# Patient Record
Sex: Female | Born: 1980 | Race: Black or African American | Hispanic: No | Marital: Single | Smoking: Never smoker
Health system: Southern US, Community
[De-identification: ages and names within clinical notes are randomized; demographics above are authoritative.]

## PROBLEM LIST (undated history)

## (undated) ENCOUNTER — Inpatient Hospital Stay (HOSPITAL_COMMUNITY): Payer: Self-pay

## (undated) DIAGNOSIS — T8859XA Other complications of anesthesia, initial encounter: Secondary | ICD-10-CM

## (undated) DIAGNOSIS — Z789 Other specified health status: Secondary | ICD-10-CM

## (undated) DIAGNOSIS — T4145XA Adverse effect of unspecified anesthetic, initial encounter: Secondary | ICD-10-CM

## (undated) DIAGNOSIS — O24419 Gestational diabetes mellitus in pregnancy, unspecified control: Secondary | ICD-10-CM

## (undated) HISTORY — DX: Other specified health status: Z78.9

## (undated) HISTORY — PX: APPENDECTOMY: SHX54

## (undated) HISTORY — DX: Adverse effect of unspecified anesthetic, initial encounter: T41.45XA

## (undated) HISTORY — DX: Other complications of anesthesia, initial encounter: T88.59XA

---

## 2016-05-18 ENCOUNTER — Encounter: Payer: Self-pay | Admitting: Emergency Medicine

## 2016-05-18 DIAGNOSIS — O26891 Other specified pregnancy related conditions, first trimester: Secondary | ICD-10-CM | POA: Insufficient documentation

## 2016-05-18 DIAGNOSIS — Z3A10 10 weeks gestation of pregnancy: Secondary | ICD-10-CM | POA: Insufficient documentation

## 2016-05-18 DIAGNOSIS — O219 Vomiting of pregnancy, unspecified: Secondary | ICD-10-CM | POA: Diagnosis present

## 2016-05-18 DIAGNOSIS — R102 Pelvic and perineal pain: Secondary | ICD-10-CM | POA: Diagnosis not present

## 2016-05-18 NOTE — ED Triage Notes (Signed)
Pt ambulatory to triage in NAD, states she may be 10-[redacted]wks pregnant LMP 12/25, report nausea and fever over past week.

## 2016-05-19 ENCOUNTER — Emergency Department: Payer: Medicaid Other

## 2016-05-19 ENCOUNTER — Emergency Department
Admission: EM | Admit: 2016-05-19 | Discharge: 2016-05-19 | Disposition: A | Discharge status: Home / Self Care | Payer: Medicaid Other | Attending: Emergency Medicine | Admitting: Emergency Medicine

## 2016-05-19 DIAGNOSIS — O219 Vomiting of pregnancy, unspecified: Secondary | ICD-10-CM

## 2016-05-19 DIAGNOSIS — R109 Unspecified abdominal pain: Secondary | ICD-10-CM

## 2016-05-19 DIAGNOSIS — O26891 Other specified pregnancy related conditions, first trimester: Secondary | ICD-10-CM

## 2016-05-19 LAB — COMPREHENSIVE METABOLIC PANEL
ALK PHOS: 67 U/L (ref 38–126)
ALT: 18 U/L (ref 14–54)
ANION GAP: 7 (ref 5–15)
AST: 20 U/L (ref 15–41)
Albumin: 3.6 g/dL (ref 3.5–5.0)
BILIRUBIN TOTAL: 0.5 mg/dL (ref 0.3–1.2)
BUN: 8 mg/dL (ref 6–20)
CALCIUM: 8.5 mg/dL — AB (ref 8.9–10.3)
CO2: 25 mmol/L (ref 22–32)
CREATININE: 0.67 mg/dL (ref 0.44–1.00)
Chloride: 104 mmol/L (ref 101–111)
GFR calc non Af Amer: >60 mL/min (ref >60)
GLUCOSE: 103 mg/dL — AB (ref 65–99)
Potassium: 3.3 mmol/L — ABNORMAL LOW (ref 3.5–5.1)
Sodium: 136 mmol/L (ref 135–145)
TOTAL PROTEIN: 6.8 g/dL (ref 6.5–8.1)

## 2016-05-19 LAB — URINALYSIS, COMPLETE (UACMP) WITH MICROSCOPIC
Bilirubin Urine: NEGATIVE
GLUCOSE, UA: NEGATIVE mg/dL
HGB URINE DIPSTICK: NEGATIVE
Ketones, ur: NEGATIVE mg/dL
Leukocytes, UA: NEGATIVE
NITRITE: NEGATIVE
PH: 5 (ref 5.0–8.0)
PROTEIN: NEGATIVE mg/dL
RBC / HPF: NONE SEEN RBC/hpf (ref 0–5)
SPECIFIC GRAVITY, URINE: 1.024 (ref 1.005–1.030)

## 2016-05-19 LAB — CBC
HCT: 35.4 % (ref 35.0–47.0)
HEMOGLOBIN: 12.1 g/dL (ref 12.0–16.0)
MCH: 30.7 pg (ref 26.0–34.0)
MCHC: 34.1 g/dL (ref 32.0–36.0)
MCV: 90.2 fL (ref 80.0–100.0)
PLATELETS: 235 10*3/uL (ref 150–440)
RBC: 3.92 MIL/uL (ref 3.80–5.20)
RDW: 13.9 % (ref 11.5–14.5)
WBC: 9.6 10*3/uL (ref 3.6–11.0)

## 2016-05-19 LAB — HCG, QUANTITATIVE, PREGNANCY: hCG, Beta Chain, Quant, S: 55648 m[IU]/mL — ABNORMAL HIGH (ref <5)

## 2016-05-19 LAB — LIPASE, BLOOD: Lipase: 16 U/L (ref 11–51)

## 2016-05-19 LAB — POCT PREGNANCY, URINE: Preg Test, Ur: POSITIVE — AB

## 2016-05-19 MED ORDER — ONDANSETRON HCL 4 MG/2ML IJ SOLN: 4.0000 mg | INTRAMUSCULAR | Status: DC

## 2016-05-19 MED ORDER — ONDANSETRON 4 MG PO TBDP: ORAL_TABLET | ORAL | 0 refills | Status: DC

## 2016-05-19 MED ORDER — ONDANSETRON HCL 4 MG/2ML IJ SOLN
4.0000 mg | INTRAMUSCULAR | Status: AC
  Administered 2016-05-19: 4 mg via INTRAVENOUS
  Filled 2016-05-19: qty 2

## 2016-05-19 NOTE — ED Notes (Signed)
Pt. States certain smells will make her feel nauseated, pt. States "I am not able to wear certain perfumes or smell some foods.  Pt. States symptoms started about one week ago.

## 2016-05-19 NOTE — ED Notes (Signed)
Attempted blood draw from right antecub without success. 

## 2016-05-19 NOTE — ED Provider Notes (Signed)
[facility] Emergency Department Provider Note  ____________________________________________   First MD Initiated Contact with Patient 05/19/16 [phone]     (approximate)  I have reviewed the triage vital signs and the nursing notes.   HISTORY  Chief Complaint Nausea and Fever    HPI Vicki Griffin is a 36 y.o. female G3P2 at about 10-11 weeks by dates who presents for evaluation of gradual onset nausea and vomiting and occasional lower abdominal pain for weeks.  She states that she thought it was because she was pregnant but she thought that the nausea and vomiting should have improved by now.  Certain smells and foods make it worse and nothing in particular makes it better.  She has not sought prenatal care yet because she is new to the area.  She has 2 children at home and no history of miscarriage or abortion.  She denies vaginal bleeding.  She is also not had any dysuria.  No fever/chills, chest pain, shortness of breath, diarrhea.  She occasionally has a cough which makes the aching abdominal pain worse.  She has no abdominal pain at this time.  She did not have any complaints of fever to me although apparently she mentioned it in triage.   History reviewed. No pertinent past medical history.  There are no active problems to display for this patient.   History reviewed. No pertinent surgical history.  Prior to Admission medications   Medication Sig Start Date End Date Taking? Authorizing Provider  ondansetron (ZOFRAN ODT) 4 MG disintegrating tablet Allow 1-2 tablets to dissolve in your mouth every 8 hours as needed for nausea/vomiting 05/19/16   Loleta Roseory Delrick Dehart, MD    Allergies Patient has no known allergies.  History reviewed. No pertinent family history.  Social History Social History  Substance Use Topics  . Smoking status: Never Smoker  . Smokeless tobacco: Never Used  . Alcohol use No    Review of Systems Constitutional: No  fever/chills Eyes: No visual changes. ENT: No sore throat. Cardiovascular: Denies chest pain. Respiratory: Denies shortness of breath. Gastrointestinal: Occasional abdominal pain.  Persistent N/V.  No diarrhea.  No constipation. Genitourinary: Negative for dysuria.  No vaginal bleeding. Musculoskeletal: Negative for back pain. Skin: Negative for rash. Neurological: Negative for headaches, focal weakness or numbness.  10-point ROS otherwise negative.  ____________________________________________   PHYSICAL EXAM:  VITAL SIGNS: ED Triage Vitals  Enc Vitals Group     BP 05/18/16 2327 (!) 150/120     Pulse Rate 05/18/16 2327 90     Resp 05/18/16 2327 18     Temp 05/18/16 2327 99.3 F (37.4 C)     Temp Source 05/18/16 2327 Oral     SpO2 05/18/16 2327 97 %     Weight 05/18/16 2327 227 lb 9.6 oz (103.2 kg)     Height 05/18/16 2327 5\' 2"  (1.575 m)     Head Circumference --      Peak Flow --      Pain Score 05/18/16 2328 0     Pain Loc --      Pain Edu? --      Excl. in GC? --     Constitutional: Alert and oriented. Well appearing and in no acute distress. Eyes: Conjunctivae are normal. PERRL. EOMI. Head: Atraumatic. Nose: No congestion/rhinnorhea. Mouth/Throat: Mucous membranes are moist. Neck: No stridor.  No meningeal signs.   Cardiovascular: Normal rate, regular rhythm. Good peripheral circulation. Grossly normal heart sounds. Respiratory: Normal respiratory effort.  No  retractions. Lungs CTAB. Gastrointestinal: Soft and nontender. No distention.  Genitourinary: deferred Musculoskeletal: No lower extremity tenderness nor edema. No gross deformities of extremities. Neurologic:  Normal speech and language. No gross focal neurologic deficits are appreciated.  Skin:  Skin is warm, dry and intact. No rash noted. Psychiatric: Mood and affect are flat.  Normal speech.  ____________________________________________   LABS (all labs ordered are listed, but only abnormal  results are displayed)  Labs Reviewed  COMPREHENSIVE METABOLIC PANEL - Abnormal; Notable for the following:       Result Value   Potassium 3.3 (*)    Glucose, Bld 103 (*)    Calcium 8.5 (*)    All other components within normal limits  URINALYSIS, COMPLETE (UACMP) WITH MICROSCOPIC - Abnormal; Notable for the following:    Color, Urine YELLOW (*)    APPearance HAZY (*)    Bacteria, UA RARE (*)    Squamous Epithelial / LPF 0-5 (*)    All other components within normal limits  HCG, QUANTITATIVE, PREGNANCY - Abnormal; Notable for the following:    hCG, Beta Chain, Quant, S 96,045 (*)    All other components within normal limits  POCT PREGNANCY, URINE - Abnormal; Notable for the following:    Preg Test, Ur POSITIVE (*)    All other components within normal limits  LIPASE, BLOOD  CBC  POC URINE PREG, ED   ____________________________________________  EKG  None - EKG not ordered by ED physician ____________________________________________  RADIOLOGY   US Ob Comp < 14 Wks  Result Date: 05/19/2016 CLINICAL DATA:  Acute onset of pelvic pain.  Initial encounter. EXAM: OBSTETRIC <14 WK ULTRASOUND TECHNIQUE: Transabdominal ultrasound was performed for evaluation of the gestation as well as the maternal uterus and adnexal regions. COMPARISON:  None. FINDINGS: Intrauterine gestational sac: Single; visualized and normal in shape. Yolk sac:  No Embryo:  Yes Cardiac Activity: Yes Heart Rate: 165 bpm CRL:   3.3 cm   10 w 1 d                  [place] EDC: 12/14/2016 Subchorionic hemorrhage:  None visualized. Maternal uterus/adnexae: The uterus is unremarkable in appearance. The ovaries are within normal limits. The right ovary measures 3.1 x 2.2 x 1.6 cm, while the left ovary measures 4.1 x 3.1 x 2.3 cm. No suspicious adnexal masses are seen; there is no evidence for ovarian torsion. No free fluid is seen within the pelvic cul-de-sac. IMPRESSION: Single live intrauterine pregnancy noted, with a crown-rump  length of 3.3 cm, corresponding to a gestational age of [redacted] weeks 1 day. This matches the gestational age of [redacted] weeks 5 days by LMP, reflecting an estimated date of delivery of December 17, 2016. Electronically Signed   By: Roanna Raider M.D.   On: 05/19/2016 04:14    ____________________________________________   PROCEDURES  Procedure(s) performed:   Procedures   Critical Care performed: No ____________________________________________   INITIAL IMPRESSION / ASSESSMENT AND PLAN / ED COURSE  Pertinent labs & imaging results that were available during my care of the patient were reviewed by me and considered in my medical decision making (see chart for details).  Well-appearing, NAD.  Doubt 150/120 was initial BP, will recheck.  Still some persistent nausea, will give additional Zofran.  Abd is non-tender to palpation. Will check bedside U/S.  Patient has no history of vaginal bleeding which is reassuring.  Counseled patient about importance of establishing care with an OB provider.   Clinical  Course as of May 19 448  Sat May 19, 2016  0252 Difficulty visualizing fetus on bedside ultrasound.  Able to appreciate cardiac activity, but unable to fully visualize the full fetus, as will as unable to r/o previa, abruption, etc.  Will obtain formal U/S including transabdominal and transvaginal approaches.  Patient prefers this and agrees with plan.  [CF]  0443 VSS, patient in NAD.  U/S reassuring.  Updated patient, gave usual/customary return precautions.  Patient agrees with plan for outpatient follow up.  [CF]    Clinical Course User Index [CF] Loleta Rose, MD    ____________________________________________  FINAL CLINICAL IMPRESSION(S) / ED DIAGNOSES  Final diagnoses:  Abdominal pain during pregnancy in first trimester  Vomiting during pregnancy     MEDICATIONS GIVEN DURING THIS VISIT:  Medications  ondansetron (ZOFRAN) injection 4 mg (0 mg Intravenous Hold 05/19/16 0303)   ondansetron (ZOFRAN) injection 4 mg (4 mg Intravenous Given 05/19/16 0157)     NEW OUTPATIENT MEDICATIONS STARTED DURING THIS VISIT:  New Prescriptions   ONDANSETRON (ZOFRAN ODT) 4 MG DISINTEGRATING TABLET    Allow 1-2 tablets to dissolve in your mouth every 8 hours as needed for nausea/vomiting    Modified Medications   No medications on file    Discontinued Medications   No medications on file     Note:  This document was prepared using Dragon voice recognition software and may include unintentional dictation errors.    Loleta Rose, MD 05/19/16 [phone]

## 2016-05-19 NOTE — Discharge Instructions (Signed)
You have been seen in the Emergency Department (ED) for intermittent abdominal pain and vomiting during pregnancy.  Fortunately, your evaluation was reassuring, and the ultrasound showed a normal pregnancy at about [redacted] weeks gestation.  Please start taking prenatal vitamins (over the counter) if you are not already doing so.    Please follow up as recommended above.  If you develop any other symptoms that concern you (including, but not limited to, persistent vomiting, worsening bleeding, abdominal or pelvic pain, or fever greater than 101), please return immediately to the Emergency Department.

## 2016-05-19 NOTE — ED Notes (Signed)
Patient transported to Ultrasound 

## 2016-05-19 NOTE — ED Notes (Signed)
Pt. Going home with friend 

## 2016-08-01 ENCOUNTER — Other Ambulatory Visit (HOSPITAL_COMMUNITY)
Admission: RE | Admit: 2016-08-01 | Discharge: 2016-08-01 | Disposition: A | Discharge status: Home / Self Care | Payer: Medicaid Other | Source: Ambulatory Visit | Attending: Family Medicine | Admitting: Family Medicine

## 2016-08-01 ENCOUNTER — Encounter: Payer: Self-pay | Admitting: Family Medicine

## 2016-08-01 ENCOUNTER — Ambulatory Visit (INDEPENDENT_AMBULATORY_CARE_PROVIDER_SITE_OTHER): Payer: Medicaid Other | Admitting: Family Medicine

## 2016-08-01 DIAGNOSIS — Z3480 Encounter for supervision of other normal pregnancy, unspecified trimester: Secondary | ICD-10-CM | POA: Insufficient documentation

## 2016-08-01 DIAGNOSIS — Z3482 Encounter for supervision of other normal pregnancy, second trimester: Secondary | ICD-10-CM

## 2016-08-01 DIAGNOSIS — O0932 Supervision of pregnancy with insufficient antenatal care, second trimester: Secondary | ICD-10-CM

## 2016-08-01 DIAGNOSIS — O0990 Supervision of high risk pregnancy, unspecified, unspecified trimester: Secondary | ICD-10-CM | POA: Insufficient documentation

## 2016-08-01 DIAGNOSIS — O99212 Obesity complicating pregnancy, second trimester: Secondary | ICD-10-CM

## 2016-08-01 DIAGNOSIS — O0930 Supervision of pregnancy with insufficient antenatal care, unspecified trimester: Secondary | ICD-10-CM | POA: Insufficient documentation

## 2016-08-01 DIAGNOSIS — E669 Obesity, unspecified: Secondary | ICD-10-CM

## 2016-08-01 DIAGNOSIS — O99210 Obesity complicating pregnancy, unspecified trimester: Secondary | ICD-10-CM | POA: Insufficient documentation

## 2016-08-01 DIAGNOSIS — Z98891 History of uterine scar from previous surgery: Secondary | ICD-10-CM | POA: Insufficient documentation

## 2016-08-01 DIAGNOSIS — O09522 Supervision of elderly multigravida, second trimester: Secondary | ICD-10-CM | POA: Insufficient documentation

## 2016-08-01 DIAGNOSIS — O34219 Maternal care for unspecified type scar from previous cesarean delivery: Secondary | ICD-10-CM

## 2016-08-01 NOTE — Progress Notes (Signed)
   Initial PRENATAL VISIT NOTE  Subjective:  Vicki Griffin is a 36 y.o. G3P2002 at 7729w2d being seen today for ongoing prenatal care.  She is currently monitored for the following issues for this high-risk pregnancy and has Supervision of other normal pregnancy, antepartum; AMA (advanced maternal age) multigravida 35+, second trimester; Late prenatal care affecting pregnancy, antepartum; History of C-section; and Obesity in pregnancy, antepartum on her problem list.  [facility], had US there consistent with LMP Reports PTL with prior pregnancy, CS at 37 weeks.   Patient reports no complaints.   . Vag. Bleeding: None.  Movement: Present. Denies leaking of fluid.   The following portions of the patient's history were reviewed and updated as appropriate: allergies, current medications, past family history, past medical history, past social history, past surgical history and problem list. Problem list updated.  Objective:   Vitals:   08/01/16 1602  BP: 122/75  Pulse: 97  Weight: 247 lb (112 kg)    Fetal Status: Fetal Heart Rate (bpm): 154 Fundal Height: 21 cm Movement: Present     General:  Alert, oriented and cooperative. Patient is in no acute distress.  Skin: Skin is warm and dry. No rash noted.   Cardiovascular: Normal heart rate noted  Respiratory: Normal respiratory effort, no problems with respiration noted  Abdomen: Soft, gravid, appropriate for gestational age. Pain/Pressure: Present     Pelvic:  Cervical exam deferred        Extremities: Normal range of motion.  Edema: Trace  Mental Status: Normal mood and affect. Normal behavior. Normal judgment and thought content.   Assessment and Plan:  Pregnancy: G3P2002 at 3929w2d  1. Supervision of other normal pregnancy, antepartum - Initial prenatal visit, reviewed history.  - Inherit[employer] Society Guided - Cytology - PAP - Obstetric Panel, Including HIV - Culture, OB Urine - Glucose, random - Hemoglobin A1c - US MFM OB  DETAIL +14 WK; Future  2. AMA (advanced maternal age) multigravida 35+, second trimester - US MFM OB DETAIL +14 WK; Future  3. Late prenatal care affecting pregnancy, antepartum  4. History of C-section Plan for scheduled repeat CS at 39 weeks, desires BTL  The nature of [facility] with multiple MDs and other Advanced Practice Providers was explained to patient; also emphasized that residents, students are part of our team.  >50% of 30 min visit spent on counseling and coordination of care.   Preterm labor symptoms and general obstetric precautions including but not limited to vaginal bleeding, contractions, leaking of fluid and fetal movement were reviewed in detail with the patient. Please refer to After Visit Summary for other counseling recommendations.  Return in about 4 weeks (around 08/29/2016) for Routine prenatal care.   Federico FlakeKimberly Niles Lilo Wallington, MD

## 2016-08-01 NOTE — Patient Instructions (Signed)
Second Trimester of Pregnancy The second trimester is from week 13 through week 28, month 4 through 6. This is often the time in pregnancy that you feel your best. Often times, morning sickness has lessened or quit. You may have more energy, and you may get hungry more often. Your unborn baby (fetus) is growing rapidly. At the end of the sixth month, he or she is about 9 inches long and weighs about 1 pounds. You will likely feel the baby move (quickening) between 18 and 20 weeks of pregnancy. Follow these instructions at home:  Avoid all smoking, herbs, and alcohol. Avoid drugs not approved by your doctor.  Do not use any tobacco products, including cigarettes, chewing tobacco, and electronic cigarettes. If you need help quitting, ask your doctor. You may get counseling or other support to help you quit.  Only take medicine as told by your doctor. Some medicines are safe and some are not during pregnancy.  Exercise only as told by your doctor. Stop exercising if you start having cramps.  Eat regular, healthy meals.  Wear a good support bra if your breasts are tender.  Do not use hot tubs, steam rooms, or saunas.  Wear your seat belt when driving.  Avoid raw meat, uncooked cheese, and liter boxes and soil used by cats.  Take your prenatal vitamins.  Take 1500-2000 milligrams of calcium daily starting at the 20th week of pregnancy until you deliver your baby.  Try taking medicine that helps you poop (stool softener) as needed, and if your doctor approves. Eat more fiber by eating fresh fruit, vegetables, and whole grains. Drink enough fluids to keep your pee (urine) clear or pale yellow.  Take warm water baths (sitz baths) to soothe pain or discomfort caused by hemorrhoids. Use hemorrhoid cream if your doctor approves.  If you have puffy, bulging veins (varicose veins), wear support hose. Raise (elevate) your feet for 15 minutes, 3-4 times a day. Limit salt in your diet.  Avoid heavy  lifting, wear low heals, and sit up straight.  Rest with your legs raised if you have leg cramps or low back pain.  Visit your dentist if you have not gone during your pregnancy. Use a soft toothbrush to brush your teeth. Be gentle when you floss.  You can have sex (intercourse) unless your doctor tells you not to.  Go to your doctor visits. Get help if:  You feel dizzy.  You have mild cramps or pressure in your lower belly (abdomen).  You have a nagging pain in your belly area.  You continue to feel sick to your stomach (nauseous), throw up (vomit), or have watery poop (diarrhea).  You have bad smelling fluid coming from your vagina.  You have pain with peeing (urination). Get help right away if:  You have a fever.  You are leaking fluid from your vagina.  You have spotting or bleeding from your vagina.  You have severe belly cramping or pain.  You lose or gain weight rapidly.  You have trouble catching your breath and have chest pain.  You notice sudden or extreme puffiness (swelling) of your face, hands, ankles, feet, or legs.  You have not felt the baby move in over an hour.  You have severe headaches that do not go away with medicine.  You have vision changes. This information is not intended to replace advice given to you by your health care provider. Make sure you discuss any questions you have with your health care   provider. Document Released: 05/30/2009 Document Revised: 08/11/2015 Document Reviewed: 05/06/2012 Elsevier Interactive Patient Education  2017 [employer]  

## 2016-08-02 ENCOUNTER — Encounter: Payer: Self-pay | Admitting: *Deleted

## 2016-08-03 LAB — CULTURE, OB URINE

## 2016-08-03 LAB — URINE CULTURE, OB REFLEX: ORGANISM ID, BACTERIA: NO GROWTH

## 2016-08-06 LAB — CYTOLOGY - PAP
Adequacy: ABSENT
Chlamydia: NEGATIVE
Diagnosis: NEGATIVE
HPV: NOT DETECTED
Neisseria Gonorrhea: NEGATIVE

## 2016-08-07 ENCOUNTER — Ambulatory Visit (HOSPITAL_COMMUNITY)
Admission: RE | Admit: 2016-08-07 | Discharge: 2016-08-07 | Disposition: A | Discharge status: Home / Self Care | Payer: Medicaid Other | Source: Ambulatory Visit | Attending: Family Medicine | Admitting: Family Medicine

## 2016-08-07 ENCOUNTER — Encounter (HOSPITAL_COMMUNITY): Payer: Self-pay

## 2016-08-07 ENCOUNTER — Other Ambulatory Visit: Payer: Self-pay | Admitting: Family Medicine

## 2016-08-07 DIAGNOSIS — O09522 Supervision of elderly multigravida, second trimester: Secondary | ICD-10-CM | POA: Diagnosis not present

## 2016-08-07 DIAGNOSIS — O09219 Supervision of pregnancy with history of pre-term labor, unspecified trimester: Secondary | ICD-10-CM | POA: Diagnosis not present

## 2016-08-07 DIAGNOSIS — Z363 Encounter for antenatal screening for malformations: Secondary | ICD-10-CM

## 2016-08-07 DIAGNOSIS — Z3A21 21 weeks gestation of pregnancy: Secondary | ICD-10-CM

## 2016-08-07 DIAGNOSIS — Z8279 Family history of other congenital malformations, deformations and chromosomal abnormalities: Secondary | ICD-10-CM | POA: Insufficient documentation

## 2016-08-07 DIAGNOSIS — O0930 Supervision of pregnancy with insufficient antenatal care, unspecified trimester: Secondary | ICD-10-CM

## 2016-08-07 DIAGNOSIS — Z3482 Encounter for supervision of other normal pregnancy, second trimester: Secondary | ICD-10-CM | POA: Diagnosis not present

## 2016-08-07 DIAGNOSIS — O0932 Supervision of pregnancy with insufficient antenatal care, second trimester: Secondary | ICD-10-CM | POA: Diagnosis not present

## 2016-08-07 DIAGNOSIS — Z3480 Encounter for supervision of other normal pregnancy, unspecified trimester: Secondary | ICD-10-CM

## 2016-08-07 DIAGNOSIS — O99212 Obesity complicating pregnancy, second trimester: Secondary | ICD-10-CM | POA: Diagnosis not present

## 2016-08-07 DIAGNOSIS — O34219 Maternal care for unspecified type scar from previous cesarean delivery: Secondary | ICD-10-CM | POA: Diagnosis not present

## 2016-08-07 DIAGNOSIS — Z98891 History of uterine scar from previous surgery: Secondary | ICD-10-CM

## 2016-08-07 DIAGNOSIS — O99210 Obesity complicating pregnancy, unspecified trimester: Secondary | ICD-10-CM

## 2016-08-07 NOTE — Addendum Note (Signed)
Encounter addended by: Tommie Raymondester, Brinnley Lacap T on: 08/07/2016  4:52 PM<BR>    Actions taken: Imaging Exam ended

## 2016-08-08 ENCOUNTER — Other Ambulatory Visit (HOSPITAL_COMMUNITY): Payer: Self-pay | Admitting: *Deleted

## 2016-08-08 DIAGNOSIS — O09522 Supervision of elderly multigravida, second trimester: Secondary | ICD-10-CM

## 2016-08-14 LAB — INHERITEST SOCIETY GUIDED

## 2016-08-14 LAB — OBSTETRIC PANEL, INCLUDING HIV
Antibody Screen: NEGATIVE
BASOS ABS: 0 10*3/uL (ref 0.0–0.2)
Basos: 0 %
EOS (ABSOLUTE): 0.1 10*3/uL (ref 0.0–0.4)
EOS: 1 %
HEMOGLOBIN: 11.9 g/dL (ref 11.1–15.9)
HEP B S AG: NEGATIVE
HIV Screen 4th Generation wRfx: NONREACTIVE
Hematocrit: 36.3 % (ref 34.0–46.6)
IMMATURE GRANS (ABS): 0.1 10*3/uL (ref 0.0–0.1)
IMMATURE GRANULOCYTES: 1 %
LYMPHS: 27 %
Lymphocytes Absolute: 2.7 10*3/uL (ref 0.7–3.1)
MCH: 30.2 pg (ref 26.6–33.0)
MCHC: 32.8 g/dL (ref 31.5–35.7)
MCV: 92 fL (ref 79–97)
MONOCYTES: 6 %
Monocytes Absolute: 0.6 10*3/uL (ref 0.1–0.9)
NEUTROS PCT: 65 %
Neutrophils Absolute: 6.6 10*3/uL (ref 1.4–7.0)
PLATELETS: 231 10*3/uL (ref 150–379)
RBC: 3.94 x10E6/uL (ref 3.77–5.28)
RDW: 13.7 % (ref 12.3–15.4)
RH TYPE: POSITIVE
RPR: NONREACTIVE
Rubella Antibodies, IGG: 13.6 index (ref >0.99)
WBC: 10.1 10*3/uL (ref 3.4–10.8)

## 2016-08-14 LAB — GLUCOSE, RANDOM: Glucose: 109 mg/dL — ABNORMAL HIGH (ref 65–99)

## 2016-08-14 LAB — HEMOGLOBIN A1C
ESTIMATED AVERAGE GLUCOSE: 88 mg/dL
Hgb A1c MFr Bld: 4.7 % — ABNORMAL LOW (ref 4.8–5.6)

## 2016-09-04 ENCOUNTER — Ambulatory Visit (INDEPENDENT_AMBULATORY_CARE_PROVIDER_SITE_OTHER): Payer: Medicaid Other | Admitting: Obstetrics & Gynecology

## 2016-09-04 ENCOUNTER — Encounter: Payer: Self-pay | Admitting: *Deleted

## 2016-09-04 VITALS — BP 102/67 | HR 97 | Ht 68.0 in | Wt 254.0 lb

## 2016-09-04 DIAGNOSIS — Z3482 Encounter for supervision of other normal pregnancy, second trimester: Secondary | ICD-10-CM | POA: Diagnosis not present

## 2016-09-04 DIAGNOSIS — Z3480 Encounter for supervision of other normal pregnancy, unspecified trimester: Secondary | ICD-10-CM

## 2016-09-04 DIAGNOSIS — O09522 Supervision of elderly multigravida, second trimester: Secondary | ICD-10-CM

## 2016-09-04 NOTE — Progress Notes (Signed)
   PRENATAL VISIT NOTE  Subjective:  Vicki Griffin is a 36 y.o. G3P2002 at 4077w1d being seen today for ongoing prenatal care.  She is currently monitored for the following issues for this low-risk pregnancy and has Supervision of other normal pregnancy, antepartum; AMA (advanced maternal age) multigravida 36+, second trimester; Late prenatal care affecting pregnancy, antepartum; History of C-section x 2; and Obesity in pregnancy, antepartum on her problem list.  Patient reports no complaints.  Contractions: Not present. Vag. Bleeding: None.  Movement: Present. Denies leaking of fluid.   The following portions of the patient's history were reviewed and updated as appropriate: allergies, current medications, past family history, past medical history, past social history, past surgical history and problem list. Problem list updated.  Objective:   Vitals:   09/04/16 1414 09/04/16 1431  BP: 102/67   Pulse: 97   Weight: 254 lb (115.2 kg)   Height:  5\' 8"  (1.727 m)    Fetal Status: Fetal Heart Rate (bpm): 151 Fundal Height: 25 cm Movement: Present     General:  Alert, oriented and cooperative. Patient is in no acute distress.  Skin: Skin is warm and dry. No rash noted.   Cardiovascular: Normal heart rate noted  Respiratory: Normal respiratory effort, no problems with respiration noted  Abdomen: Soft, gravid, appropriate for gestational age. Pain/Pressure: Present     Pelvic:  Cervical exam deferred        Extremities: Normal range of motion.  Edema: Trace  Mental Status: Normal mood and affect. Normal behavior. Normal judgment and thought content.   Assessment and Plan:  Pregnancy: G3P2002 at 7177w1d  1. Supervision of other normal pregnancy, antepartum 2. AMA (advanced maternal age) multigravida 36+, second trimester Follow up anatomy scan scheduled for tomorrow. Offered NIPS today, declined for now.  Preterm labor symptoms and general obstetric precautions including but not limited  to vaginal bleeding, contractions, leaking of fluid and fetal movement were reviewed in detail with the patient. Please refer to After Visit Summary for other counseling recommendations.  Return in about 3 weeks (around 09/25/2016) for OB Visit, 2 hr GTT, 3rd trimester labs, TDap.   Jaynie CollinsUgonna Alicha Raspberry, MD

## 2016-09-04 NOTE — Patient Instructions (Addendum)
[facility]   Thank you for enrolling in MyChart. Please follow the instructions below to securely access your online medical record. MyChart allows you to send messages to your doctor, view your test results, manage appointments, and more.   How Do I Sign Up? 1. In your Internet browser, go to [employer] and enter [email or web address]. 2. Click on the Sign Up Now link in the Sign In box. You will see the New Member Sign Up page. 3. Enter your MyChart Access Code exactly as it appears below. You will not need to use this code after you've completed the sign-up process. If you do not sign up before the expiration date, you must request a new code.  MyChart Access Code: [email or web address] Expires: 09/30/2016  5:04 PM  4. Enter your Social Security Number ([id number]) and Date of Birth (mm/dd/yyyy) as indicated and click Submit. You will be taken to the next sign-up page. 5. Create a MyChart ID. This will be your MyChart login ID and cannot be changed, so think of one that is secure and easy to remember. 6. Create a MyChart password. You can change your password at any time. 7. Enter your Password Reset Question and Answer. This can be used at a later time if you forget your password.  8. Enter your e-mail address. You will receive e-mail notification when new information is available in MyChart. 9. Click Sign Up. You can now view your medical record.   Additional Information Remember, MyChart is NOT to be used for urgent needs. For medical emergencies, dial 911.     Return to clinic for any scheduled appointments or obstetric concerns, or go to [facility] for evaluation   Third Trimester of Pregnancy The third trimester is from week 28 through week 40 (months 7 through 9). The third trimester is a time when the unborn baby (fetus) is growing rapidly. At the end of the ninth month, the fetus is about 20 inches in length and weighs 6-10 pounds. Body changes during your  third trimester Your body will continue to go through many changes during pregnancy. The changes vary from woman to woman. During the third trimester:  Your weight will continue to increase. You can expect to gain 25-35 pounds (11-16 kg) by the end of the pregnancy.  You may begin to get stretch marks on your hips, abdomen, and breasts.  You may urinate more often because the fetus is moving lower into your pelvis and pressing on your bladder.  You may develop or continue to have heartburn. This is caused by increased hormones that slow down muscles in the digestive tract.  You may develop or continue to have constipation because increased hormones slow digestion and cause the muscles that push waste through your intestines to relax.  You may develop hemorrhoids. These are swollen veins (varicose veins) in the rectum that can itch or be painful.  You may develop swollen, bulging veins (varicose veins) in your legs.  You may have increased body aches in the pelvis, back, or thighs. This is due to weight gain and increased hormones that are relaxing your joints.  You may have changes in your hair. These can include thickening of your hair, rapid growth, and changes in texture. Some women also have hair loss during or after pregnancy, or hair that feels dry or thin. Your hair will most likely return to normal after your baby is born.  Your breasts will continue to grow and they will continue to become tender. A  yellow fluid (colostrum) may leak from your breasts. This is the first milk you are producing for your baby.  Your belly button may stick out.  You may notice more swelling in your hands, face, or ankles.  You may have increased tingling or numbness in your hands, arms, and legs. The skin on your belly may also feel numb.  You may feel short of breath because of your expanding uterus.  You may have more problems sleeping. This can be caused by the size of your belly, increased need  to urinate, and an increase in your body's metabolism.  You may notice the fetus "dropping," or moving lower in your abdomen (lightening).  You may have increased vaginal discharge.  You may notice your joints feel loose and you may have pain around your pelvic bone.  What to expect at prenatal visits You will have prenatal exams every 2 weeks until week 36. Then you will have weekly prenatal exams. During a routine prenatal visit:  You will be weighed to make sure you and the baby are growing normally.  Your blood pressure will be taken.  Your abdomen will be measured to track your baby's growth.  The fetal heartbeat will be listened to.  Any test results from the previous visit will be discussed.  You may have a cervical check near your due date to see if your cervix has softened or thinned (effaced).  You will be tested for Group B streptococcus. This happens between 35 and 37 weeks.  Your health care provider may ask you:  What your birth plan is.  How you are feeling.  If you are feeling the baby move.  If you have had any abnormal symptoms, such as leaking fluid, bleeding, severe headaches, or abdominal cramping.  If you are using any tobacco products, including cigarettes, chewing tobacco, and electronic cigarettes.  If you have any questions.  Other tests or screenings that may be performed during your third trimester include:  Blood tests that check for low iron levels (anemia).  Fetal testing to check the health, activity level, and growth of the fetus. Testing is done if you have certain medical conditions or if there are problems during the pregnancy.  Nonstress test (NST). This test checks the health of your baby to make sure there are no signs of problems, such as the baby not getting enough oxygen. During this test, a belt is placed around your belly. The baby is made to move, and its heart rate is monitored during movement.  What is false labor? False  labor is a condition in which you feel small, irregular tightenings of the muscles in the womb (contractions) that usually go away with rest, changing position, or drinking water. These are called Braxton Hicks contractions. Contractions may last for hours, days, or even weeks before true labor sets in. If contractions come at regular intervals, become more frequent, increase in intensity, or become painful, you should see your health care provider. What are the signs of labor?  Abdominal cramps.  Regular contractions that start at 10 minutes apart and become stronger and more frequent with time.  Contractions that start on the top of the uterus and spread down to the lower abdomen and back.  Increased pelvic pressure and dull back pain.  A watery or bloody mucus discharge that comes from the vagina.  Leaking of amniotic fluid. This is also known as your "water breaking." It could be a slow trickle or a gush. Let your  health care provider know if it has a color or strange odor. If you have any of these signs, call your health care provider right away, even if it is before your due date. Follow these instructions at home: Medicines  Follow your health care provider's instructions regarding medicine use. Specific medicines may be either safe or unsafe to take during pregnancy.  Take a prenatal vitamin that contains at least 600 micrograms (mcg) of folic acid.  If you develop constipation, try taking a stool softener if your health care provider approves. Eating and drinking  Eat a balanced diet that includes fresh fruits and vegetables, whole grains, good sources of protein such as meat, eggs, or tofu, and low-fat dairy. Your health care provider will help you determine the amount of weight gain that is right for you.  Avoid raw meat and uncooked cheese. These carry germs that can cause birth defects in the baby.  If you have low calcium intake from food, talk to your health care provider  about whether you should take a daily calcium supplement.  Eat four or five small meals rather than three large meals a day.  Limit foods that are high in fat and processed sugars, such as fried and sweet foods.  To prevent constipation: ? Drink enough fluid to keep your urine clear or pale yellow. ? Eat foods that are high in fiber, such as fresh fruits and vegetables, whole grains, and beans. Activity  Exercise only as directed by your health care provider. Most women can continue their usual exercise routine during pregnancy. Try to exercise for 30 minutes at least 5 days a week. Stop exercising if you experience uterine contractions.  Avoid heavy lifting.  Do not exercise in extreme heat or humidity, or at high altitudes.  Wear low-heel, comfortable shoes.  Practice good posture.  You may continue to have sex unless your health care provider tells you otherwise. Relieving pain and discomfort  Take frequent breaks and rest with your legs elevated if you have leg cramps or low back pain.  Take warm sitz baths to soothe any pain or discomfort caused by hemorrhoids. Use hemorrhoid cream if your health care provider approves.  Wear a good support bra to prevent discomfort from breast tenderness.  If you develop varicose veins: ? Wear support pantyhose or compression stockings as told by your healthcare provider. ? Elevate your feet for 15 minutes, 3-4 times a day. Prenatal care  Write down your questions. Take them to your prenatal visits.  Keep all your prenatal visits as told by your health care provider. This is important. Safety  Wear your seat belt at all times when driving.  Make a list of emergency phone numbers, including numbers for family, friends, the hospital, and police and fire departments. General instructions  Avoid cat litter boxes and soil used by cats. These carry germs that can cause birth defects in the baby. If you have a cat, ask someone to clean the  litter box for you.  Do not travel far distances unless it is absolutely necessary and only with the approval of your health care provider.  Do not use hot tubs, steam rooms, or saunas.  Do not drink alcohol.  Do not use any products that contain nicotine or tobacco, such as cigarettes and e-cigarettes. If you need help quitting, ask your health care provider.  Do not use any medicinal herbs or unprescribed drugs. These chemicals affect the formation and growth of the baby.  Do not  douche or use tampons or scented sanitary pads.  Do not cross your legs for long periods of time.  To prepare for the arrival of your baby: ? Take prenatal classes to understand, practice, and ask questions about labor and delivery. ? Make a trial run to the hospital. ? Visit the hospital and tour the maternity area. ? Arrange for maternity or paternity leave through employers. ? Arrange for family and friends to take care of pets while you are in the hospital. ? Purchase a rear-facing car seat and make sure you know how to install it in your car. ? Pack your hospital bag. ? Prepare the baby's nursery. Make sure to remove all pillows and stuffed animals from the baby's crib to prevent suffocation.  Visit your dentist if you have not gone during your pregnancy. Use a soft toothbrush to brush your teeth and be gentle when you floss. Contact a health care provider if:  You are unsure if you are in labor or if your water has broken.  You become dizzy.  You have mild pelvic cramps, pelvic pressure, or nagging pain in your abdominal area.  You have lower back pain.  You have persistent nausea, vomiting, or diarrhea.  You have an unusual or bad smelling vaginal discharge.  You have pain when you urinate. Get help right away if:  Your water breaks before 37 weeks.  You have regular contractions less than 5 minutes apart before 37 weeks.  You have a fever.  You are leaking fluid from your  vagina.  You have spotting or bleeding from your vagina.  You have severe abdominal pain or cramping.  You have rapid weight loss or weight gain.  You have shortness of breath with chest pain.  You notice sudden or extreme swelling of your face, hands, ankles, feet, or legs.  Your baby makes fewer than 10 movements in 2 hours.  You have severe headaches that do not go away when you take medicine.  You have vision changes. Summary  The third trimester is from week 28 through week 40, months 7 through 9. The third trimester is a time when the unborn baby (fetus) is growing rapidly.  During the third trimester, your discomfort may increase as you and your baby continue to gain weight. You may have abdominal, leg, and back pain, sleeping problems, and an increased need to urinate.  During the third trimester your breasts will keep growing and they will continue to become tender. A yellow fluid (colostrum) may leak from your breasts. This is the first milk you are producing for your baby.  False labor is a condition in which you feel small, irregular tightenings of the muscles in the womb (contractions) that eventually go away. These are called Braxton Hicks contractions. Contractions may last for hours, days, or even weeks before true labor sets in.  Signs of labor can include: abdominal cramps; regular contractions that start at 10 minutes apart and become stronger and more frequent with time; watery or bloody mucus discharge that comes from the vagina; increased pelvic pressure and dull back pain; and leaking of amniotic fluid. This information is not intended to replace advice given to you by your health care provider. Make sure you discuss any questions you have with your health care provider. Document Released: 02/27/2001 Document Revised: 08/11/2015 Document Reviewed: 05/06/2012 Elsevier Interactive Patient Education  2017 [employer]

## 2016-09-05 ENCOUNTER — Other Ambulatory Visit (HOSPITAL_COMMUNITY): Payer: Self-pay | Admitting: Obstetrics and Gynecology

## 2016-09-05 ENCOUNTER — Encounter (HOSPITAL_COMMUNITY): Payer: Self-pay

## 2016-09-05 ENCOUNTER — Ambulatory Visit (HOSPITAL_COMMUNITY)
Admission: RE | Admit: 2016-09-05 | Discharge: 2016-09-05 | Disposition: A | Discharge status: Home / Self Care | Payer: Medicaid Other | Source: Ambulatory Visit | Attending: Family Medicine | Admitting: Family Medicine

## 2016-09-05 DIAGNOSIS — O34219 Maternal care for unspecified type scar from previous cesarean delivery: Secondary | ICD-10-CM

## 2016-09-05 DIAGNOSIS — Z8279 Family history of other congenital malformations, deformations and chromosomal abnormalities: Secondary | ICD-10-CM | POA: Insufficient documentation

## 2016-09-05 DIAGNOSIS — O99210 Obesity complicating pregnancy, unspecified trimester: Secondary | ICD-10-CM

## 2016-09-05 DIAGNOSIS — Z362 Encounter for other antenatal screening follow-up: Secondary | ICD-10-CM

## 2016-09-05 DIAGNOSIS — O0932 Supervision of pregnancy with insufficient antenatal care, second trimester: Secondary | ICD-10-CM | POA: Insufficient documentation

## 2016-09-05 DIAGNOSIS — O09522 Supervision of elderly multigravida, second trimester: Secondary | ICD-10-CM | POA: Diagnosis present

## 2016-09-05 DIAGNOSIS — Z3480 Encounter for supervision of other normal pregnancy, unspecified trimester: Secondary | ICD-10-CM

## 2016-09-05 DIAGNOSIS — O99212 Obesity complicating pregnancy, second trimester: Secondary | ICD-10-CM

## 2016-09-05 DIAGNOSIS — O09212 Supervision of pregnancy with history of pre-term labor, second trimester: Secondary | ICD-10-CM

## 2016-09-05 DIAGNOSIS — O0930 Supervision of pregnancy with insufficient antenatal care, unspecified trimester: Secondary | ICD-10-CM

## 2016-09-05 DIAGNOSIS — Z3A25 25 weeks gestation of pregnancy: Secondary | ICD-10-CM

## 2016-09-05 DIAGNOSIS — Z6841 Body Mass Index (BMI) 40.0 and over, adult: Secondary | ICD-10-CM | POA: Diagnosis not present

## 2016-09-05 DIAGNOSIS — Z98891 History of uterine scar from previous surgery: Secondary | ICD-10-CM

## 2016-09-05 DIAGNOSIS — E669 Obesity, unspecified: Secondary | ICD-10-CM | POA: Diagnosis not present

## 2016-09-05 NOTE — Addendum Note (Signed)
Encounter addended by: Tommie Raymondester, Chace Klippel T on: 09/05/2016 10:32 AM<BR>    Actions taken: Imaging Exam ended

## 2016-09-27 ENCOUNTER — Encounter: Payer: Self-pay | Admitting: *Deleted

## 2016-09-27 ENCOUNTER — Ambulatory Visit (INDEPENDENT_AMBULATORY_CARE_PROVIDER_SITE_OTHER): Payer: Medicaid Other | Admitting: Obstetrics & Gynecology

## 2016-09-27 VITALS — BP 100/67 | HR 85 | Wt 261.0 lb

## 2016-09-27 DIAGNOSIS — O9989 Other specified diseases and conditions complicating pregnancy, childbirth and the puerperium: Secondary | ICD-10-CM

## 2016-09-27 DIAGNOSIS — Z23 Encounter for immunization: Secondary | ICD-10-CM

## 2016-09-27 DIAGNOSIS — O26893 Other specified pregnancy related conditions, third trimester: Secondary | ICD-10-CM

## 2016-09-27 DIAGNOSIS — Z98891 History of uterine scar from previous surgery: Secondary | ICD-10-CM

## 2016-09-27 DIAGNOSIS — O09522 Supervision of elderly multigravida, second trimester: Secondary | ICD-10-CM

## 2016-09-27 DIAGNOSIS — Z3483 Encounter for supervision of other normal pregnancy, third trimester: Secondary | ICD-10-CM

## 2016-09-27 DIAGNOSIS — Z3480 Encounter for supervision of other normal pregnancy, unspecified trimester: Secondary | ICD-10-CM

## 2016-09-27 DIAGNOSIS — M549 Dorsalgia, unspecified: Secondary | ICD-10-CM

## 2016-09-27 DIAGNOSIS — O09523 Supervision of elderly multigravida, third trimester: Secondary | ICD-10-CM

## 2016-09-27 DIAGNOSIS — O34219 Maternal care for unspecified type scar from previous cesarean delivery: Secondary | ICD-10-CM

## 2016-09-27 MED ORDER — CYCLOBENZAPRINE HCL 10 MG PO TABS: 10.0000 mg | ORAL_TABLET | Freq: Three times a day (TID) | ORAL | 1 refills | Status: DC | PRN

## 2016-09-27 NOTE — Patient Instructions (Signed)
Return to clinic for any scheduled appointments or obstetric concerns, or go to [facility] for evaluation   Third Trimester of Pregnancy The third trimester is from week 28 through week 40 (months 7 through 9). The third trimester is a time when the unborn baby (fetus) is growing rapidly. At the end of the ninth month, the fetus is about 20 inches in length and weighs 6-10 pounds. Body changes during your third trimester Your body will continue to go through many changes during pregnancy. The changes vary from woman to woman. During the third trimester:  Your weight will continue to increase. You can expect to gain 25-35 pounds (11-16 kg) by the end of the pregnancy.  You may begin to get stretch marks on your hips, abdomen, and breasts.  You may urinate more often because the fetus is moving lower into your pelvis and pressing on your bladder.  You may develop or continue to have heartburn. This is caused by increased hormones that slow down muscles in the digestive tract.  You may develop or continue to have constipation because increased hormones slow digestion and cause the muscles that push waste through your intestines to relax.  You may develop hemorrhoids. These are swollen veins (varicose veins) in the rectum that can itch or be painful.  You may develop swollen, bulging veins (varicose veins) in your legs.  You may have increased body aches in the pelvis, back, or thighs. This is due to weight gain and increased hormones that are relaxing your joints.  You may have changes in your hair. These can include thickening of your hair, rapid growth, and changes in texture. Some women also have hair loss during or after pregnancy, or hair that feels dry or thin. Your hair will most likely return to normal after your baby is born.  Your breasts will continue to grow and they will continue to become tender. A yellow fluid (colostrum) may leak from your breasts. This is the first milk you are  producing for your baby.  Your belly button may stick out.  You may notice more swelling in your hands, face, or ankles.  You may have increased tingling or numbness in your hands, arms, and legs. The skin on your belly may also feel numb.  You may feel short of breath because of your expanding uterus.  You may have more problems sleeping. This can be caused by the size of your belly, increased need to urinate, and an increase in your body's metabolism.  You may notice the fetus "dropping," or moving lower in your abdomen (lightening).  You may have increased vaginal discharge.  You may notice your joints feel loose and you may have pain around your pelvic bone.  What to expect at prenatal visits You will have prenatal exams every 2 weeks until week 36. Then you will have weekly prenatal exams. During a routine prenatal visit:  You will be weighed to make sure you and the baby are growing normally.  Your blood pressure will be taken.  Your abdomen will be measured to track your baby's growth.  The fetal heartbeat will be listened to.  Any test results from the previous visit will be discussed.  You may have a cervical check near your due date to see if your cervix has softened or thinned (effaced).  You will be tested for Group B streptococcus. This happens between 35 and 37 weeks.  Your health care provider may ask you:  What your birth plan is.    How you are feeling.  If you are feeling the baby move.  If you have had any abnormal symptoms, such as leaking fluid, bleeding, severe headaches, or abdominal cramping.  If you are using any tobacco products, including cigarettes, chewing tobacco, and electronic cigarettes.  If you have any questions.  Other tests or screenings that may be performed during your third trimester include:  Blood tests that check for low iron levels (anemia).  Fetal testing to check the health, activity level, and growth of the fetus.  Testing is done if you have certain medical conditions or if there are problems during the pregnancy.  Nonstress test (NST). This test checks the health of your baby to make sure there are no signs of problems, such as the baby not getting enough oxygen. During this test, a belt is placed around your belly. The baby is made to move, and its heart rate is monitored during movement.  What is false labor? False labor is a condition in which you feel small, irregular tightenings of the muscles in the womb (contractions) that usually go away with rest, changing position, or drinking water. These are called Braxton Hicks contractions. Contractions may last for hours, days, or even weeks before true labor sets in. If contractions come at regular intervals, become more frequent, increase in intensity, or become painful, you should see your health care provider. What are the signs of labor?  Abdominal cramps.  Regular contractions that start at 10 minutes apart and become stronger and more frequent with time.  Contractions that start on the top of the uterus and spread down to the lower abdomen and back.  Increased pelvic pressure and dull back pain.  A watery or bloody mucus discharge that comes from the vagina.  Leaking of amniotic fluid. This is also known as your "water breaking." It could be a slow trickle or a gush. Let your health care provider know if it has a color or strange odor. If you have any of these signs, call your health care provider right away, even if it is before your due date. Follow these instructions at home: Medicines  Follow your health care provider's instructions regarding medicine use. Specific medicines may be either safe or unsafe to take during pregnancy.  Take a prenatal vitamin that contains at least 600 micrograms (mcg) of folic acid.  If you develop constipation, try taking a stool softener if your health care provider approves. Eating and drinking  Eat a  balanced diet that includes fresh fruits and vegetables, whole grains, good sources of protein such as meat, eggs, or tofu, and low-fat dairy. Your health care provider will help you determine the amount of weight gain that is right for you.  Avoid raw meat and uncooked cheese. These carry germs that can cause birth defects in the baby.  If you have low calcium intake from food, talk to your health care provider about whether you should take a daily calcium supplement.  Eat four or five small meals rather than three large meals a day.  Limit foods that are high in fat and processed sugars, such as fried and sweet foods.  To prevent constipation: ? Drink enough fluid to keep your urine clear or pale yellow. ? Eat foods that are high in fiber, such as fresh fruits and vegetables, whole grains, and beans. Activity  Exercise only as directed by your health care provider. Most women can continue their usual exercise routine during pregnancy. Try to exercise for 30   minutes at least 5 days a week. Stop exercising if you experience uterine contractions.  Avoid heavy lifting.  Do not exercise in extreme heat or humidity, or at high altitudes.  Wear low-heel, comfortable shoes.  Practice good posture.  You may continue to have sex unless your health care provider tells you otherwise. Relieving pain and discomfort  Take frequent breaks and rest with your legs elevated if you have leg cramps or low back pain.  Take warm sitz baths to soothe any pain or discomfort caused by hemorrhoids. Use hemorrhoid cream if your health care provider approves.  Wear a good support bra to prevent discomfort from breast tenderness.  If you develop varicose veins: ? Wear support pantyhose or compression stockings as told by your healthcare provider. ? Elevate your feet for 15 minutes, 3-4 times a day. Prenatal care  Write down your questions. Take them to your prenatal visits.  Keep all your prenatal  visits as told by your health care provider. This is important. Safety  Wear your seat belt at all times when driving.  Make a list of emergency phone numbers, including numbers for family, friends, the hospital, and police and fire departments. General instructions  Avoid cat litter boxes and soil used by cats. These carry germs that can cause birth defects in the baby. If you have a cat, ask someone to clean the litter box for you.  Do not travel far distances unless it is absolutely necessary and only with the approval of your health care provider.  Do not use hot tubs, steam rooms, or saunas.  Do not drink alcohol.  Do not use any products that contain nicotine or tobacco, such as cigarettes and e-cigarettes. If you need help quitting, ask your health care provider.  Do not use any medicinal herbs or unprescribed drugs. These chemicals affect the formation and growth of the baby.  Do not douche or use tampons or scented sanitary pads.  Do not cross your legs for long periods of time.  To prepare for the arrival of your baby: ? Take prenatal classes to understand, practice, and ask questions about labor and delivery. ? Make a trial run to the hospital. ? Visit the hospital and tour the maternity area. ? Arrange for maternity or paternity leave through employers. ? Arrange for family and friends to take care of pets while you are in the hospital. ? Purchase a rear-facing car seat and make sure you know how to install it in your car. ? Pack your hospital bag. ? Prepare the baby's nursery. Make sure to remove all pillows and stuffed animals from the baby's crib to prevent suffocation.  Visit your dentist if you have not gone during your pregnancy. Use a soft toothbrush to brush your teeth and be gentle when you floss. Contact a health care provider if:  You are unsure if you are in labor or if your water has broken.  You become dizzy.  You have mild pelvic cramps, pelvic  pressure, or nagging pain in your abdominal area.  You have lower back pain.  You have persistent nausea, vomiting, or diarrhea.  You have an unusual or bad smelling vaginal discharge.  You have pain when you urinate. Get help right away if:  Your water breaks before 37 weeks.  You have regular contractions less than 5 minutes apart before 37 weeks.  You have a fever.  You are leaking fluid from your vagina.  You have spotting or bleeding from your vagina.    You have severe abdominal pain or cramping.  You have rapid weight loss or weight gain.  You have shortness of breath with chest pain.  You notice sudden or extreme swelling of your face, hands, ankles, feet, or legs.  Your baby makes fewer than 10 movements in 2 hours.  You have severe headaches that do not go away when you take medicine.  You have vision changes. Summary  The third trimester is from week 28 through week 40, months 7 through 9. The third trimester is a time when the unborn baby (fetus) is growing rapidly.  During the third trimester, your discomfort may increase as you and your baby continue to gain weight. You may have abdominal, leg, and back pain, sleeping problems, and an increased need to urinate.  During the third trimester your breasts will keep growing and they will continue to become tender. A yellow fluid (colostrum) may leak from your breasts. This is the first milk you are producing for your baby.  False labor is a condition in which you feel small, irregular tightenings of the muscles in the womb (contractions) that eventually go away. These are called Braxton Hicks contractions. Contractions may last for hours, days, or even weeks before true labor sets in.  Signs of labor can include: abdominal cramps; regular contractions that start at 10 minutes apart and become stronger and more frequent with time; watery or bloody mucus discharge that comes from the vagina; increased pelvic pressure  and dull back pain; and leaking of amniotic fluid. This information is not intended to replace advice given to you by your health care provider. Make sure you discuss any questions you have with your health care provider. Document Released: 02/27/2001 Document Revised: 08/11/2015 Document Reviewed: 05/06/2012 Elsevier Interactive Patient Education  2017 [employer]  

## 2016-09-27 NOTE — Progress Notes (Signed)
   PRENATAL VISIT NOTE  Subjective:  Vicki Griffin is a 36 y.o. G3P2002 at 36 451w3d being seen today for ongoing prenatal care.  She is currently monitored for the following issues for this low-risk pregnancy and has Supervision of other normal pregnancy, antepartum; AMA (advanced maternal age) multigravida 36+, second trimester; Late prenatal care affecting pregnancy, antepartum; History of C-section x 2; and Obesity in pregnancy, antepartum on her problem list.  Patient reports backache occasionally.  Contractions: Irritability. Vag. Bleeding: None.  Movement: Present. Denies leaking of fluid.   The following portions of the patient's history were reviewed and updated as appropriate: allergies, current medications, past family history, past medical history, past social history, past surgical history and problem list. Problem list updated.  Objective:   Vitals:   09/27/16 0910  BP: 100/67  Pulse: 85  Weight: 261 lb (118.4 kg)    Fetal Status: Fetal Heart Rate (bpm): 128 Fundal Height: 29 cm Movement: Present     General:  Alert, oriented and cooperative. Patient is in no acute distress.  Skin: Skin is warm and dry. No rash noted.   Cardiovascular: Normal heart rate noted  Respiratory: Normal respiratory effort, no problems with respiration noted  Abdomen: Soft, gravid, appropriate for gestational age. Pain/Pressure: Present     Pelvic:  Cervical exam deferred        Extremities: Normal range of motion.  Edema: Mild pitting, slight indentation  Mental Status: Normal mood and affect. Normal behavior. Normal judgment and thought content.   Assessment and Plan:  Pregnancy: G3P2002 at [redacted]w[redacted]d  1. AMA (advanced maternal age) multigravida 36+, second trimester Harmony today - Genetic Screening  2. History of C-section x 2 Desires RCS and BTS, order sent for scheduling at 39 weeks  3. Back pain affecting pregnancy in third trimester Recommended Tylenol and maternity support bet.  Flexeril prescribed. - cyclobenzaprine (FLEXERIL) 10 MG tablet; Take 1 tablet (10 mg total) by mouth every 8 (eight) hours as needed for muscle spasms.  Dispense: 30 tablet; Refill: 1  4. Supervision of other normal pregnancy, antepartum Third trimester labs and Tdap today - CBC - HIV antibody - RPR - Glucose Tolerance, 2 Hours w/1 Hour - Tdap vaccine greater than or equal to 7yo IM Preterm labor symptoms and general obstetric precautions including but not limited to vaginal bleeding, contractions, leaking of fluid and fetal movement were reviewed in detail with the patient. Please refer to After Visit Summary for other counseling recommendations.  Return in about 2 weeks (around 10/11/2016) for OB Visit.   Jaynie CollinsUgonna Porscha Axley, MD

## 2016-09-28 LAB — CBC
Hematocrit: 35.4 % (ref 34.0–46.6)
Hemoglobin: 11.4 g/dL (ref 11.1–15.9)
MCH: 29.7 pg (ref 26.6–33.0)
MCHC: 32.2 g/dL (ref 31.5–35.7)
MCV: 92 fL (ref 79–97)
PLATELETS: 227 10*3/uL (ref 150–379)
RBC: 3.84 x10E6/uL (ref 3.77–5.28)
RDW: 13.7 % (ref 12.3–15.4)
WBC: 9.4 10*3/uL (ref 3.4–10.8)

## 2016-09-28 LAB — GLUCOSE TOLERANCE, 2 HOURS W/ 1HR
GLUCOSE, 1 HOUR: 170 mg/dL (ref 65–179)
GLUCOSE, 2 HOUR: 116 mg/dL (ref 65–152)
GLUCOSE, FASTING: 92 mg/dL — AB (ref 65–91)

## 2016-09-28 LAB — HIV ANTIBODY (ROUTINE TESTING W REFLEX): HIV SCREEN 4TH GENERATION: NONREACTIVE

## 2016-09-28 LAB — RPR: RPR Ser Ql: NONREACTIVE

## 2016-10-01 ENCOUNTER — Telehealth: Payer: Self-pay | Admitting: *Deleted

## 2016-10-01 ENCOUNTER — Encounter: Payer: Self-pay | Admitting: Obstetrics & Gynecology

## 2016-10-01 ENCOUNTER — Encounter: Payer: Self-pay | Admitting: *Deleted

## 2016-10-01 DIAGNOSIS — O24410 Gestational diabetes mellitus in pregnancy, diet controlled: Secondary | ICD-10-CM

## 2016-10-01 DIAGNOSIS — Z8632 Personal history of gestational diabetes: Secondary | ICD-10-CM | POA: Insufficient documentation

## 2016-10-01 MED ORDER — ACCU-CHEK SOFT TOUCH LANCETS MISC: 12 refills | Status: DC

## 2016-10-01 MED ORDER — GLUCOSE BLOOD VI STRP: ORAL_STRIP | 12 refills | Status: DC

## 2016-10-01 MED ORDER — ACCU-CHEK AVIVA DEVI: 0 refills | Status: DC

## 2016-10-01 NOTE — Telephone Encounter (Signed)
-----   Message from Tereso NewcomerUgonna A Anyanwu, MD sent at 10/01/2016  8:53 AM EDT ----- Patient has gestational diabetes [ ]  GDM education and testing supplies [ ]  Nutrition consult [ ]  Growth scan around 38 weeks Discuss implications of GDM in pregnancy, need for optimizing glycemic control to decrease GDM associated maternal-fetal morbidity and mortality, possible need for antenatal testing and frequent ultrasounds/prenatal visits. Please call to inform patient of results and recommendations.

## 2016-10-01 NOTE — Telephone Encounter (Signed)
Informed pt of results and recommendations.  Referral sent to [facility] and supplies sent to pharmacy.

## 2016-10-04 ENCOUNTER — Telehealth: Payer: Self-pay | Admitting: *Deleted

## 2016-10-04 DIAGNOSIS — O24410 Gestational diabetes mellitus in pregnancy, diet controlled: Secondary | ICD-10-CM

## 2016-10-04 MED ORDER — FOLIC ACID 800 MCG PO TABS: 800.0000 ug | ORAL_TABLET | Freq: Every day | ORAL | 4 refills | Status: DC

## 2016-10-04 NOTE — Telephone Encounter (Signed)
Called pt to inform her of normal Harmony results, no answer, left VM.  Also received fax from [facility] requesting a refill on Folic Acid, rx sent to pharmacy.

## 2016-10-09 ENCOUNTER — Encounter (HOSPITAL_COMMUNITY): Payer: Self-pay

## 2016-10-10 ENCOUNTER — Encounter: Payer: Self-pay | Admitting: *Deleted

## 2016-10-10 ENCOUNTER — Ambulatory Visit (INDEPENDENT_AMBULATORY_CARE_PROVIDER_SITE_OTHER): Payer: Medicaid Other | Admitting: Family Medicine

## 2016-10-10 VITALS — BP 112/68 | HR 91 | Wt 263.0 lb

## 2016-10-10 DIAGNOSIS — O0993 Supervision of high risk pregnancy, unspecified, third trimester: Secondary | ICD-10-CM

## 2016-10-10 DIAGNOSIS — O24410 Gestational diabetes mellitus in pregnancy, diet controlled: Secondary | ICD-10-CM

## 2016-10-10 DIAGNOSIS — O09523 Supervision of elderly multigravida, third trimester: Secondary | ICD-10-CM

## 2016-10-10 DIAGNOSIS — O0930 Supervision of pregnancy with insufficient antenatal care, unspecified trimester: Secondary | ICD-10-CM

## 2016-10-10 DIAGNOSIS — O09522 Supervision of elderly multigravida, second trimester: Secondary | ICD-10-CM

## 2016-10-10 DIAGNOSIS — O0933 Supervision of pregnancy with insufficient antenatal care, third trimester: Secondary | ICD-10-CM

## 2016-10-10 DIAGNOSIS — O99210 Obesity complicating pregnancy, unspecified trimester: Secondary | ICD-10-CM

## 2016-10-10 DIAGNOSIS — Z98891 History of uterine scar from previous surgery: Secondary | ICD-10-CM

## 2016-10-10 DIAGNOSIS — O99213 Obesity complicating pregnancy, third trimester: Secondary | ICD-10-CM

## 2016-10-10 NOTE — Progress Notes (Signed)
   PRENATAL VISIT NOTE  Subjective:  Vicki Griffin is a 36 y.o. G3P2002 at 5359w2d being seen today for ongoing prenatal care.  She is currently monitored for the following issues for this low-risk pregnancy and has Supervision of high-risk pregnancy; AMA (advanced maternal age) multigravida 35+, second trimester; Late prenatal care affecting pregnancy, antepartum; History of C-section x 2; Obesity in pregnancy, antepartum; and Gestational diabetes mellitus, antepartum on her problem list.  Patient reports no complaints.  Contractions: Irritability. Vag. Bleeding: None.  Movement: Present. Denies leaking of fluid.   Blood sugar log reviewed today Fasting -- 97, 88, 92, 95,101, 93,97 (4 above goal) 2hr pp- 90-131 (3 above goal)  The following portions of the patient's history were reviewed and updated as appropriate: allergies, current medications, past family history, past medical history, past social history, past surgical history and problem list. Problem list updated.  Objective:   Vitals:   10/10/16 1027  BP: 112/68  Pulse: 91  Weight: 263 lb (119.3 kg)    Fetal Status: Fetal Heart Rate (bpm): 148   Movement: Present     General:  Alert, oriented and cooperative. Patient is in no acute distress.  Skin: Skin is warm and dry. No rash noted.   Cardiovascular: Normal heart rate noted  Respiratory: Normal respiratory effort, no problems with respiration noted  Abdomen: Soft, gravid, appropriate for gestational age.  Pain/Pressure: Present     Pelvic: Cervical exam deferred        Extremities: Normal range of motion.  Edema: Mild pitting, slight indentation  Mental Status:  Normal mood and affect. Normal behavior. Normal judgment and thought content.   Assessment and Plan:  Pregnancy: G3P2002 at 8959w2d  1. Obesity in pregnancy, antepartum 33 lb (15 kg) - above goal for weight gain  2. History of C-section x 2 Desires repeat CS, now does not want BTS. I sent message to  schedulers  3. Late prenatal care affecting pregnancy, antepartum  4. AMA (advanced maternal age) multigravida 35+, second trimester Reviewed NIPT results today. Low risk NIP    5. Supervision of high risk pregnancy in third trimester Up to date  6. Diet controlled gestational diabetes mellitus (GDM), antepartum Discussed out of goal values. Reviewed GDM diet and recommended decreased carb intake at evenign meal and adding in walking for 30 minutes 5 times per week Reinforced BS goals -- Fasting < 95 and 2hr pp<120.  Ordered growth US for 36 weeks   Preterm labor symptoms and general obstetric precautions including but not limited to vaginal bleeding, contractions, leaking of fluid and fetal movement were reviewed in detail with the patient. Please refer to After Visit Summary for other counseling recommendations.  Return in about 1 week (around 10/17/2016) for BS review.   Federico FlakeKimberly Niles Aneyah Lortz, MD

## 2016-10-17 ENCOUNTER — Encounter: Payer: Self-pay | Admitting: Registered"

## 2016-10-17 ENCOUNTER — Ambulatory Visit (INDEPENDENT_AMBULATORY_CARE_PROVIDER_SITE_OTHER): Payer: Medicaid Other | Admitting: Student

## 2016-10-17 ENCOUNTER — Encounter: Payer: Medicaid Other | Attending: Obstetrics & Gynecology | Admitting: Registered"

## 2016-10-17 VITALS — BP 109/73 | HR 92 | Wt 262.0 lb

## 2016-10-17 DIAGNOSIS — Z3A36 36 weeks gestation of pregnancy: Secondary | ICD-10-CM | POA: Diagnosis not present

## 2016-10-17 DIAGNOSIS — O0993 Supervision of high risk pregnancy, unspecified, third trimester: Secondary | ICD-10-CM

## 2016-10-17 DIAGNOSIS — Z713 Dietary counseling and surveillance: Secondary | ICD-10-CM | POA: Insufficient documentation

## 2016-10-17 DIAGNOSIS — R7309 Other abnormal glucose: Secondary | ICD-10-CM

## 2016-10-17 DIAGNOSIS — O24410 Gestational diabetes mellitus in pregnancy, diet controlled: Secondary | ICD-10-CM

## 2016-10-17 DIAGNOSIS — O99213 Obesity complicating pregnancy, third trimester: Secondary | ICD-10-CM | POA: Insufficient documentation

## 2016-10-17 NOTE — Progress Notes (Signed)
Patient was seen on 10/17/16 for Gestational Diabetes self-management class at [facility]. The following learning objectives were met by the patient during this course:   States the definition of Gestational Diabetes  States why dietary management is important in controlling blood glucose  Describes the effects each nutrient has on blood glucose levels  Demonstrates ability to create a balanced meal plan  Demonstrates carbohydrate counting   States when to check blood glucose levels  Demonstrates proper blood glucose monitoring techniques  States the effect of stress and exercise on blood glucose levels  States the importance of limiting caffeine and abstaining from alcohol and smoking  Blood glucose monitor given: none Lot # n/a Exp: n/a Blood glucose reading: 90  Patient instructed to monitor glucose levels: FBS: 60 - <95 1 hour: <140 2 hour: <120  Patient received handouts:  Nutrition Diabetes and Pregnancy  Carbohydrate Counting List  Patient will be seen for follow-up as needed.

## 2016-10-17 NOTE — Progress Notes (Signed)
PRENATAL VISIT NOTE  Subjective:  Vicki Griffin is a 36 y.o. G3P2002 at 7863w2d being seen today for ongoing prenatal care.  She is currently monitored for the following issues for this high-risk pregnancy and has Supervision of high-risk pregnancy; AMA (advanced maternal age) multigravida 35+, second trimester; Late prenatal care affecting pregnancy, antepartum; History of C-section x 2; Obesity in pregnancy, antepartum; and Gestational diabetes mellitus, antepartum on her problem list.  Patient reports frustration with maintaining diabetic diet.  Patient states that she is too tired in the morning to check her blood sugar at 7/30 or 8 but instead she checks it at 11, right before she eats breakfast. Until today, she did not know what to eat for her diabetic diet but now that she went to the diabetes class today, she feels like she understands. She asks me "is the solution for me to starve for the next 8 weeks?". I explained that she can eat as much as she wants as long as it is healthy vegetables and proteins. I gave her examples of healthy snacks (Greek yogurt, peanut butter, eggs, etc). Patient verbalized understanding. At this point she is very resistant to beginning any kind of medication and feels that now she can do better with her diet after her diet class.  I requested that she make an appointment to come back in 1 week, instead of two weeks, to review her log book.  I reviewed the risks to her and her baby if her diabetes is not controlled. Patient verbalized understanding.    Contractions: Irregular. Vag. Bleeding: None.  Movement: Present. Denies leaking of fluid.   The following portions of the patient's history were reviewed and updated as appropriate: allergies, current medications, past family history, past medical history, past social history, past surgical history and problem list. Problem list updated.  Log reviewed:  Fastings from 10/12/2016 to 8/1 101, 95, 101, 101, 96, 97,  90    2 hour PP:  118, 89, 110, others not recorded.    Objective:   Vitals:   10/17/16 1411  BP: 109/73  Pulse: 92  Weight: 262 lb (118.8 kg)    Fetal Status: Fetal Heart Rate (bpm): 152 Fundal Height: 32 cm Movement: Present     General:  Alert, oriented and cooperative. Patient is in no acute distress.  Skin: Skin is warm and dry. No rash noted.   Cardiovascular: Normal heart rate noted  Respiratory: Normal respiratory effort, no problems with respiration noted  Abdomen: Soft, gravid, appropriate for gestational age.  Pain/Pressure: Present     Pelvic: Cervical exam deferred        Extremities: Normal range of motion.  Edema: Mild pitting, slight indentation  Mental Status:  Normal mood and affect. Normal behavior. Normal judgment and thought content.       Guidelines for Antenatal Testing and Sonography  (with updated ICD-10 codes) INDICATION U/S NST/AFI DELIVERY  Diabetes   A1 - good control - O24.410    A2 - good control - O24.419      A2  - poor control or poor compliance - O24.419, E11.65   (Macrosomia or polyhydramnios) **E11.65 is extra code for poor control**    A2/B - O24.919  and B-C O24.319  Poor control B-C or D-R-F-T - O24.319  or  Type I DM - O24.019  20-38  20-38  20-24-28-32-36   20-24-28-32-35-38//fetal echo  20-24-27-30-33-36-38//fetal echo  40  32//2 x wk  32//2 x wk   32//2 x wk  28//BPP wkly then 32//2 x wk  40  39  PRN   39  PRN     Assessment and Plan:  Pregnancy: G3P2002 at 8493w2d 1. Diet controlled gestational diabetes mellitus (GDM), antepartum   2. Supervision of high risk pregnancy in third trimester    Preterm labor symptoms and general obstetric precautions including but not limited to vaginal bleeding, contractions, leaking of fluid and fetal movement were reviewed in detail with the patient. Please refer to After Visit Summary for other counseling recommendations.  Return in about 12 days (around  10/29/2016).   Marylene LandKathryn Lorraine Kooistra, CNM

## 2016-10-17 NOTE — Patient Instructions (Signed)
Gestational Diabetes Mellitus, Self Care When you have gestational diabetes (gestational diabetes mellitus), you must keep your blood sugar (glucose) under control. You can do this with:  Nutrition.  Exercise.  Lifestyle changes.  Medicines or insulin, if needed.  Support from your doctors and others.  How do I manage my blood sugar?  Check your blood sugar every day during pregnancy. Check it as often as told.  Call your doctor if your blood sugar is above your goal numbers for 2 tests in a row. Your doctor will set treatment goals for you. Try to have these blood sugars:  After not eating for a long time (after fasting): at or below 95 mg/dL (5.3 mmol/L).  After meals (postprandial): ? One hour after a meal: at or below 140 mg/dL (7.8 mmol/L). ? Two hours after a meal: at or below 120 mg/dL (6.7 mmol/L).  A1c (hemoglobin A1c) level: 6-6.5%.  What do I need to know about high blood sugar? High blood sugar is called hyperglycemia. Know the early signs of high blood sugar. Signs include:  Feeling: ? Thirsty. ? Hungry. ? Very tired.  Needing to pee (urinate) more than usual.  Blurry vision.  What do I need to know about low blood sugar? Low blood sugar is called hypoglycemia. This is when blood sugar is at or below 70 mg/dL (3.9 mmol/L). Symptoms may include:  Feeling: ? Hungry. ? Worried or nervous (anxious). ? Sweaty or clammy. ? Confused. ? Dizzy. ? Sleepy. ? Sick to your stomach (nauseous).  Having: ? A fast heartbeat. ? A headache. ? A change in vision. ? Jerky movements that you cannot control (seizure). ? Nightmares. ? Tingling or no feeling (numbness) around the mouth, lips, or tongue.  Having trouble with: ? Talking. ? Paying attention (concentrating). ? Moving (coordination). ? Sleeping.  Shaking.  Passing out (fainting).  Getting upset easily (irritability).  Treating low blood sugar  To treat low blood sugar, eat or drink something  sugary right away. If you can think clearly and swallow safely, follow the 15:15 rule:  Take 15 grams of a fast-acting carb (carbohydrate). Some fast-acting carbs are: ? 1 tube of glucose gel. ? 3 sugar tablets (glucose pills). ? 6-8 pieces of hard candy. ? 4 oz (120 mL) of fruit juice. ? 4 oz (120 mL) regular (not diet) soda.  Check your blood sugar 15 minutes after you take the carb.  If your blood sugar is still at or below 70 mg/dL (3.9 mmol/L), take 15 grams of a carb again.  If your blood sugar does not go above 70 mg/dL (3.9 mmol/L) after 3 tries, get help right away.  After your blood sugar goes back to normal, eat a meal or a snack within 1 hour.  Treating very low blood sugar If your blood sugar is at or below 54 mg/dL (3 mmol/L), you have very low blood sugar (severe hypoglycemia). This is an emergency. Do not wait to see if the symptoms will go away. Get medical help right away. Call your local emergency services (911 in the [place] Do not drive yourself to the hospital. If you have very low blood sugar and you cannot eat or drink, you may need a glucagon shot (injection). A family member or friend should learn:  How to check your blood sugar.  How to give you a glucagon shot.  Ask your doctor if you need a glucagon shot kit at home. What else is important to manage my diabetes? Medicine  Take your insulin and diabetes medicines as told.  Do not run out of insulin or medicines.  Adjust your insulin and diabetes medicines as told. Food   Make healthy food choices. These include: ? Chicken, fish, egg whites, and beans. ? Oats, whole wheat, bulgur, brown rice, quinoa, and millet. ? Fresh fruits and vegetables. ? Low-fat dairy products. ? Nuts, avocado, olive oil, and canola oil.  Make an eating plan. A food specialist (dietitian) can help you.  Follow instructions from your doctor about what you cannot eat or drink.  Drink enough fluid to keep your pee (urine)  clear or pale yellow.  Eat healthy snacks between healthy meals.  Keep track of carbs you eat. Read food labels. Learn about food serving sizes.  Follow your sick day plan when you cannot eat or drink normally. Make this plan with your doctor. Activity  Exercise 30 minutes or more a day or as much as told by your doctor.  Talk with your doctor if you start a new exercise. Your doctor may need to adjust your insulin, medicines, or food. Lifestyle  Do not drink alcohol.  Do not use any tobacco products. If you need help quitting, ask your doctor.  Learn how to deal with stress. If you need help with this, ask your doctor. Body care   Stay up to date with your shots (immunizations).  Brush your teeth and gums two times a day. Floss at least one time a day.  Go to the dentist least one time every 6 months.  Stay at a healthy weight while you are pregnant. General instructions  Take over-the-counter and prescription medicines only as told by your doctor.  Ask your doctor about risks of high blood pressure in pregnancy. These are called preeclampsia and eclampsia.  Share your diabetes care plan with: ? Your work or school. ? People you live with.  Check your pee for ketones: ? When you are sick. ? As told by your doctor.  Ask your doctor: ? Do I need to meet with a diabetes educator? ? Where can I find a support group for people with diabetes?  Carry a card or wear jewelry that says that you have diabetes.  Keep all follow-up visits with your doctor. This is important. Care after giving birth   Have your blood sugar checked 4-12 weeks after you give birth.  Get checked for diabetes at least every 3 years. Where to find more information: To learn more about diabetes, visit:  American Diabetes Association: www.diabetes.org/diabetes-basics/gestational  Centers for Disease Control and Prevention (CDC): http://sanchez-watson.com/.pdf  This  information is not intended to replace advice given to you by your health care provider. Make sure you discuss any questions you have with your health care provider. Document Released: 06/27/2015 Document Revised: 08/11/2015 Document Reviewed: 04/08/2015 Elsevier Interactive Patient Education  [employer].

## 2016-10-30 ENCOUNTER — Other Ambulatory Visit: Payer: Self-pay | Admitting: Obstetrics & Gynecology

## 2016-10-30 ENCOUNTER — Ambulatory Visit (INDEPENDENT_AMBULATORY_CARE_PROVIDER_SITE_OTHER): Payer: Medicaid Other | Admitting: Family Medicine

## 2016-10-30 VITALS — BP 106/71 | HR 87 | Wt 262.0 lb

## 2016-10-30 DIAGNOSIS — O0993 Supervision of high risk pregnancy, unspecified, third trimester: Secondary | ICD-10-CM

## 2016-10-30 DIAGNOSIS — O24410 Gestational diabetes mellitus in pregnancy, diet controlled: Secondary | ICD-10-CM

## 2016-10-30 MED ORDER — GLUCOSE BLOOD VI STRP: ORAL_STRIP | 12 refills | Status: AC

## 2016-10-30 MED ORDER — ACCU-CHEK SOFT TOUCH LANCETS MISC: 12 refills | Status: AC

## 2016-10-30 MED ORDER — CONCEPT DHA 53.5-38-1 MG PO CAPS: 1.0000 | ORAL_CAPSULE | Freq: Every day | ORAL | 6 refills | Status: AC

## 2016-10-30 NOTE — Progress Notes (Signed)
     PRENATAL VISIT NOTE  Subjective:  Vicki Griffin is a 36 y.o. G3P2002 at 4919w1d being seen today for ongoing prenatal care.  She is currently monitored for the following issues for this high-risk pregnancy and has Supervision of high-risk pregnancy; AMA (advanced maternal age) multigravida 35+, second trimester; Late prenatal care affecting pregnancy, antepartum; History of C-section x 2; Obesity in pregnancy, antepartum; and Gestational diabetes mellitus, antepartum on her problem list.  Patient reports diarrhea.  Contractions: Irregular. Vag. Bleeding: None.  Movement: Present. Denies leaking of fluid.   The following portions of the patient's history were reviewed and updated as appropriate: allergies, current medications, past family history, past medical history, past social history, past surgical history and problem list. Problem list updated.  Objective:   Vitals:   10/30/16 1407  BP: 106/71  Pulse: 87  Weight: 262 lb (118.8 kg)    Fetal Status: Fetal Heart Rate (bpm): 145 Fundal Height: 33 cm Movement: Present     General:  Alert, oriented and cooperative. Patient is in no acute distress.  Skin: Skin is warm and dry. No rash noted.   Cardiovascular: Normal heart rate noted  Respiratory: Normal respiratory effort, no problems with respiration noted  Abdomen: Soft, gravid, appropriate for gestational age.  Pain/Pressure: Present     Pelvic: Cervical exam deferred        Extremities: Normal range of motion.  Edema: Mild pitting, slight indentation  Mental Status:  Normal mood and affect. Normal behavior. Normal judgment and thought content.  FBS 88-127 (some eating at night and not true fasting) 2 hour 88-106 Assessment and Plan:  Pregnancy: G3P2002 at 7519w1d  1. Diet controlled gestational diabetes mellitus (GDM), antepartum Continue diet Add exercise - glucose blood (ACCU-CHEK AVIVA) test strip; Use as instructed to check blood sugars four times daily  Dispense:  120 each; Refill: 12 - Lancets (ACCU-CHEK SOFT TOUCH) lancets; Use as instructed to check blood sugars four times daily  Dispense: 120 each; Refill: 12  2. Supervision of high risk pregnancy in third trimester Continue prenatal care.  - Prenat-FeFum-FePo-FA-Omega 3 (CONCEPT DHA) 53.5-38-1 MG CAPS; Take 1 capsule by mouth daily.  Dispense: 30 capsule; Refill: 6  Immodium for diarrhea Declines her BTL Desires RCS-scheduled   Preterm labor symptoms and general obstetric precautions including but not limited to vaginal bleeding, contractions, leaking of fluid and fetal movement were reviewed in detail with the patient. Please refer to After Visit Summary for other counseling recommendations.    Reva Boresanya S Akiva Brassfield, MD

## 2016-10-30 NOTE — Patient Instructions (Signed)
Breastfeeding Deciding to breastfeed is one of the best choices you can make for you and your baby. A change in hormones during pregnancy causes your breast tissue to grow and increases the number and size of your milk ducts. These hormones also allow proteins, sugars, and fats from your blood supply to make breast milk in your milk-producing glands. Hormones prevent breast milk from being released before your baby is born as well as prompt milk flow after birth. Once breastfeeding has begun, thoughts of your baby, as well as his or her sucking or crying, can stimulate the release of milk from your milk-producing glands. Benefits of breastfeeding For Your Baby  Your first milk (colostrum) helps your baby's digestive system function better.  There are antibodies in your milk that help your baby fight off infections.  Your baby has a lower incidence of asthma, allergies, and sudden infant death syndrome.  The nutrients in breast milk are better for your baby than infant formulas and are designed uniquely for your baby's needs.  Breast milk improves your baby's brain development.  Your baby is less likely to develop other conditions, such as childhood obesity, asthma, or type 2 diabetes mellitus.  For You  Breastfeeding helps to create a very special bond between you and your baby.  Breastfeeding is convenient. Breast milk is always available at the correct temperature and costs nothing.  Breastfeeding helps to burn calories and helps you lose the weight gained during pregnancy.  Breastfeeding makes your uterus contract to its prepregnancy size faster and slows bleeding (lochia) after you give birth.  Breastfeeding helps to lower your risk of developing type 2 diabetes mellitus, osteoporosis, and breast or ovarian cancer later in life.  Signs that your baby is hungry Early Signs of Hunger  Increased alertness or activity.  Stretching.  Movement of the head from side to  side.  Movement of the head and opening of the mouth when the corner of the mouth or cheek is stroked (rooting).  Increased sucking sounds, smacking lips, cooing, sighing, or squeaking.  Hand-to-mouth movements.  Increased sucking of fingers or hands.  Late Signs of Hunger  Fussing.  Intermittent crying.  Extreme Signs of Hunger Signs of extreme hunger will require calming and consoling before your baby will be able to breastfeed successfully. Do not wait for the following signs of extreme hunger to occur before you initiate breastfeeding:  Restlessness.  A loud, strong cry.  Screaming.  Breastfeeding basics Breastfeeding Initiation  Find a comfortable place to sit or lie down, with your neck and back well supported.  Place a pillow or rolled up blanket under your baby to bring him or her to the level of your breast (if you are seated). Nursing pillows are specially designed to help support your arms and your baby while you breastfeed.  Make sure that your baby's abdomen is facing your abdomen.  Gently massage your breast. With your fingertips, massage from your chest wall toward your nipple in a circular motion. This encourages milk flow. You may need to continue this action during the feeding if your milk flows slowly.  Support your breast with 4 fingers underneath and your thumb above your nipple. Make sure your fingers are well away from your nipple and your baby's mouth.  Stroke your baby's lips gently with your finger or nipple.  When your baby's mouth is open wide enough, quickly bring your baby to your breast, placing your entire nipple and as much of the colored area   around your nipple (areola) as possible into your baby's mouth. ? More areola should be visible above your baby's upper lip than below the lower lip. ? Your baby's tongue should be between his or her lower gum and your breast.  Ensure that your baby's mouth is correctly positioned around your nipple  (latched). Your baby's lips should create a seal on your breast and be turned out (everted).  It is common for your baby to suck about 2-3 minutes in order to start the flow of breast milk.  Latching Teaching your baby how to latch on to your breast properly is very important. An improper latch can cause nipple pain and decreased milk supply for you and poor weight gain in your baby. Also, if your baby is not latched onto your nipple properly, he or she may swallow some air during feeding. This can make your baby fussy. Burping your baby when you switch breasts during the feeding can help to get rid of the air. However, teaching your baby to latch on properly is still the best way to prevent fussiness from swallowing air while breastfeeding. Signs that your baby has successfully latched on to your nipple:  Silent tugging or silent sucking, without causing you pain.  Swallowing heard between every 3-4 sucks.  Muscle movement above and in front of his or her ears while sucking.  Signs that your baby has not successfully latched on to nipple:  Sucking sounds or smacking sounds from your baby while breastfeeding.  Nipple pain.  If you think your baby has not latched on correctly, slip your finger into the corner of your baby's mouth to break the suction and place it between your baby's gums. Attempt breastfeeding initiation again. Signs of Successful Breastfeeding Signs from your baby:  A gradual decrease in the number of sucks or complete cessation of sucking.  Falling asleep.  Relaxation of his or her body.  Retention of a small amount of milk in his or her mouth.  Letting go of your breast by himself or herself.  Signs from you:  Breasts that have increased in firmness, weight, and size 1-3 hours after feeding.  Breasts that are softer immediately after breastfeeding.  Increased milk volume, as well as a change in milk consistency and color by the fifth day of  breastfeeding.  Nipples that are not sore, cracked, or bleeding.  Signs That Your Baby is Getting Enough Milk  Wetting at least 1-2 diapers during the first 24 hours after birth.  Wetting at least 5-6 diapers every 24 hours for the first week after birth. The urine should be clear or pale yellow by 5 days after birth.  Wetting 6-8 diapers every 24 hours as your baby continues to grow and develop.  At least 3 stools in a 24-hour period by age 5 days. The stool should be soft and yellow.  At least 3 stools in a 24-hour period by age 7 days. The stool should be seedy and yellow.  No loss of weight greater than 10% of birth weight during the first 3 days of age.  Average weight gain of 4-7 ounces (113-198 g) per week after age 4 days.  Consistent daily weight gain by age 5 days, without weight loss after the age of 2 weeks.  After a feeding, your baby may spit up a small amount. This is common. Breastfeeding frequency and duration Frequent feeding will help you make more milk and can prevent sore nipples and breast engorgement. Breastfeed when   you feel the need to reduce the fullness of your breasts or when your baby shows signs of hunger. This is called "breastfeeding on demand." Avoid introducing a pacifier to your baby while you are working to establish breastfeeding (the first 4-6 weeks after your baby is born). After this time you may choose to use a pacifier. Research has shown that pacifier use during the first year of a baby's life decreases the risk of sudden infant death syndrome (SIDS). Allow your baby to feed on each breast as long as he or she wants. Breastfeed until your baby is finished feeding. When your baby unlatches or falls asleep while feeding from the first breast, offer the second breast. Because newborns are often sleepy in the first few weeks of life, you may need to awaken your baby to get him or her to feed. Breastfeeding times will vary from baby to baby. However,  the following rules can serve as a guide to help you ensure that your baby is properly fed:  Newborns (babies 4 weeks of age or younger) may breastfeed every 1-3 hours.  Newborns should not go longer than 3 hours during the day or 5 hours during the night without breastfeeding.  You should breastfeed your baby a minimum of 8 times in a 24-hour period until you begin to introduce solid foods to your baby at around 6 months of age.  Breast milk pumping Pumping and storing breast milk allows you to ensure that your baby is exclusively fed your breast milk, even at times when you are unable to breastfeed. This is especially important if you are going back to work while you are still breastfeeding or when you are not able to be present during feedings. Your lactation consultant can give you guidelines on how long it is safe to store breast milk. A breast pump is a machine that allows you to pump milk from your breast into a sterile bottle. The pumped breast milk can then be stored in a refrigerator or freezer. Some breast pumps are operated by hand, while others use electricity. Ask your lactation consultant which type will work best for you. Breast pumps can be purchased, but some hospitals and breastfeeding support groups lease breast pumps on a monthly basis. A lactation consultant can teach you how to hand express breast milk, if you prefer not to use a pump. Caring for your breasts while you breastfeed Nipples can become dry, cracked, and sore while breastfeeding. The following recommendations can help keep your breasts moisturized and healthy:  Avoid using soap on your nipples.  Wear a supportive bra. Although not required, special nursing bras and tank tops are designed to allow access to your breasts for breastfeeding without taking off your entire bra or top. Avoid wearing underwire-style bras or extremely tight bras.  Air dry your nipples for 3-4minutes after each feeding.  Use only cotton  bra pads to absorb leaked breast milk. Leaking of breast milk between feedings is normal.  Use lanolin on your nipples after breastfeeding. Lanolin helps to maintain your skin's normal moisture barrier. If you use pure lanolin, you do not need to wash it off before feeding your baby again. Pure lanolin is not toxic to your baby. You may also hand express a few drops of breast milk and gently massage that milk into your nipples and allow the milk to air dry.  In the first few weeks after giving birth, some women experience extremely full breasts (engorgement). Engorgement can make your   breasts feel heavy, warm, and tender to the touch. Engorgement peaks within 3-5 days after you give birth. The following recommendations can help ease engorgement:  Completely empty your breasts while breastfeeding or pumping. You may want to start by applying warm, moist heat (in the shower or with warm water-soaked hand towels) just before feeding or pumping. This increases circulation and helps the milk flow. If your baby does not completely empty your breasts while breastfeeding, pump any extra milk after he or she is finished.  Wear a snug bra (nursing or regular) or tank top for 1-2 days to signal your body to slightly decrease milk production.  Apply ice packs to your breasts, unless this is too uncomfortable for you.  Make sure that your baby is latched on and positioned properly while breastfeeding.  If engorgement persists after 48 hours of following these recommendations, contact your health care provider or a lactation consultant. Overall health care recommendations while breastfeeding  Eat healthy foods. Alternate between meals and snacks, eating 3 of each per day. Because what you eat affects your breast milk, some of the foods may make your baby more irritable than usual. Avoid eating these foods if you are sure that they are negatively affecting your baby.  Drink milk, fruit juice, and water to  satisfy your thirst (about 10 glasses a day).  Rest often, relax, and continue to take your prenatal vitamins to prevent fatigue, stress, and anemia.  Continue breast self-awareness checks.  Avoid chewing and smoking tobacco. Chemicals from cigarettes that pass into breast milk and exposure to secondhand smoke may harm your baby.  Avoid alcohol and drug use, including marijuana. Some medicines that may be harmful to your baby can pass through breast milk. It is important to ask your health care provider before taking any medicine, including all over-the-counter and prescription medicine as well as vitamin and herbal supplements. It is possible to become pregnant while breastfeeding. If birth control is desired, ask your health care provider about options that will be safe for your baby. Contact a health care provider if:  You feel like you want to stop breastfeeding or have become frustrated with breastfeeding.  You have painful breasts or nipples.  Your nipples are cracked or bleeding.  Your breasts are red, tender, or warm.  You have a swollen area on either breast.  You have a fever or chills.  You have nausea or vomiting.  You have drainage other than breast milk from your nipples.  Your breasts do not become full before feedings by the fifth day after you give birth.  You feel sad and depressed.  Your baby is too sleepy to eat well.  Your baby is having trouble sleeping.  Your baby is wetting less than 3 diapers in a 24-hour period.  Your baby has less than 3 stools in a 24-hour period.  Your baby's skin or the white part of his or her eyes becomes yellow.  Your baby is not gaining weight by 5 days of age. Get help right away if:  Your baby is overly tired (lethargic) and does not want to wake up and feed.  Your baby develops an unexplained fever. This information is not intended to replace advice given to you by your health care provider. Make sure you discuss  any questions you have with your health care provider. Document Released: 03/05/2005 Document Revised: 08/17/2015 Document Reviewed: 08/27/2012 Elsevier Interactive Patient Education  2017 [employer]  

## 2016-11-14 ENCOUNTER — Inpatient Hospital Stay (HOSPITAL_COMMUNITY)
Admission: AD | Admit: 2016-11-14 | Discharge: 2016-11-14 | Disposition: A | Discharge status: Home / Self Care | Payer: Medicaid Other | Source: Ambulatory Visit | Attending: Obstetrics and Gynecology | Admitting: Obstetrics and Gynecology

## 2016-11-14 ENCOUNTER — Encounter (HOSPITAL_COMMUNITY): Payer: Self-pay | Admitting: Obstetrics and Gynecology

## 2016-11-14 DIAGNOSIS — O26893 Other specified pregnancy related conditions, third trimester: Secondary | ICD-10-CM | POA: Diagnosis not present

## 2016-11-14 DIAGNOSIS — Z3A35 35 weeks gestation of pregnancy: Secondary | ICD-10-CM | POA: Diagnosis not present

## 2016-11-14 DIAGNOSIS — O479 False labor, unspecified: Secondary | ICD-10-CM | POA: Diagnosis not present

## 2016-11-14 DIAGNOSIS — O4703 False labor before 37 completed weeks of gestation, third trimester: Secondary | ICD-10-CM | POA: Insufficient documentation

## 2016-11-14 DIAGNOSIS — R51 Headache: Secondary | ICD-10-CM | POA: Insufficient documentation

## 2016-11-14 DIAGNOSIS — Z79899 Other long term (current) drug therapy: Secondary | ICD-10-CM | POA: Insufficient documentation

## 2016-11-14 LAB — URINALYSIS, ROUTINE W REFLEX MICROSCOPIC
BILIRUBIN URINE: NEGATIVE
Glucose, UA: NEGATIVE mg/dL
Hgb urine dipstick: NEGATIVE
KETONES UR: NEGATIVE mg/dL
Leukocytes, UA: NEGATIVE
Nitrite: NEGATIVE
PH: 6 (ref 5.0–8.0)
Protein, ur: NEGATIVE mg/dL
SPECIFIC GRAVITY, URINE: 1.008 (ref 1.005–1.030)

## 2016-11-14 MED ORDER — NIFEDIPINE 10 MG PO CAPS
10.0000 mg | ORAL_CAPSULE | ORAL | Status: DC
  Administered 2016-11-14: 10 mg via ORAL
  Filled 2016-11-14: qty 1

## 2016-11-14 MED ORDER — DEXAMETHASONE SODIUM PHOSPHATE 10 MG/ML IJ SOLN
10.0000 mg | Freq: Once | INTRAMUSCULAR | Status: AC
  Administered 2016-11-14: 10 mg via INTRAVENOUS
  Filled 2016-11-14: qty 1

## 2016-11-14 MED ORDER — METOCLOPRAMIDE HCL 5 MG/ML IJ SOLN
10.0000 mg | Freq: Once | INTRAMUSCULAR | Status: AC
  Administered 2016-11-14: 10 mg via INTRAVENOUS
  Filled 2016-11-14: qty 2

## 2016-11-14 MED ORDER — DIPHENHYDRAMINE HCL 50 MG/ML IJ SOLN
25.0000 mg | Freq: Once | INTRAMUSCULAR | Status: AC
  Administered 2016-11-14: 25 mg via INTRAVENOUS
  Filled 2016-11-14: qty 1

## 2016-11-14 MED ORDER — SODIUM CHLORIDE 0.9 % IV SOLN
INTRAVENOUS | Status: DC
  Administered 2016-11-14: 999 mL via INTRAVENOUS

## 2016-11-14 NOTE — MAU Provider Note (Signed)
History     CSN: [id number]  Arrival date and time: 11/14/16 1844   First Provider Initiated Contact with Patient 11/14/16 2022      Chief Complaint  Patient presents with  . Contractions   HPI  Vicki Griffin is 36 yo G3P2002 at 35.[redacted] wks gestation presenting to MAU with complaints of contraction every few minutes for the last hour and a H/A that is "the worst" -- 9/10.  She denies vaginal d/c, bleeding or LOF.  She has a HR pregnancy being managed for AMA, late to care, H/O previous C/S x 2, and GDM.  Past Medical History:  Diagnosis Date  . Complication of anesthesia    Back pain related to spinal anesthesia  . Medical history non-contributory     Past Surgical History:  Procedure Laterality Date  . APPENDECTOMY    . CESAREAN SECTION      Family History  Problem Relation Age of Onset  . Arthritis Mother   . Stroke Father   . Heart disease Father   . Hypertension Father   . Breast cancer Maternal Aunt     Social History  Substance Use Topics  . Smoking status: Never Smoker  . Smokeless tobacco: Never Used  . Alcohol use No    Allergies: No Known Allergies  Prescriptions Prior to Admission  Medication Sig Dispense Refill Last Dose  . Blood Glucose Monitoring Suppl (ACCU-CHEK AVIVA PLUS) w/Device KIT See admin instructions.  0 Taking  . Blood Glucose Monitoring Suppl (ACCU-CHEK AVIVA PLUS) w/Device KIT USE AS INSTRUCTED 1 kit 0   . cyclobenzaprine (FLEXERIL) 10 MG tablet Take 1 tablet (10 mg total) by mouth every 8 (eight) hours as needed for muscle spasms. 30 tablet 1 Taking  . folic acid (FOLVITE) 481 MCG tablet Take 1 tablet (800 mcg total) by mouth daily. 30 tablet 4 Taking  . glucose blood (ACCU-CHEK AVIVA) test strip Use as instructed to check blood sugars four times daily 120 each 12   . Lancets (ACCU-CHEK SOFT TOUCH) lancets Use as instructed to check blood sugars four times daily 120 each 12   . ondansetron (ZOFRAN ODT) 4 MG disintegrating  tablet Allow 1-2 tablets to dissolve in your mouth every 8 hours as needed for nausea/vomiting 30 tablet 0 Taking  . Prenat-FeFum-FePo-FA-Omega 3 (CONCEPT DHA) 53.5-38-1 MG CAPS Take 1 capsule by mouth daily. 30 capsule 6   . Prenatal Vit-Fe Fumarate-FA (PRENATAL VITAMIN PO) Take 1 tablet by mouth daily.   Taking    Review of Systems  Constitutional: Negative.   HENT: Negative.   Eyes: Negative.   Respiratory: Negative.   Cardiovascular: Negative.   Gastrointestinal: Positive for abdominal pain (contractions).  Endocrine: Negative.   Musculoskeletal: Negative.   Skin: Negative.   Allergic/Immunologic: Negative.   Neurological: Positive for headaches ("the worst").  Hematological: Negative.   Psychiatric/Behavioral: Negative.    Physical Exam   Blood pressure (!) 109/55, pulse 88, temperature 98.3 F (36.8 C), temperature source Oral, resp. rate 20, weight 266 lb (120.7 kg), last menstrual period 03/12/2016, SpO2 99 %.  Physical Exam  Constitutional: She is oriented to person, place, and time. She appears well-developed and well-nourished.  HENT:  Head: Normocephalic.  Eyes: Pupils are equal, round, and reactive to light.  Neck: Normal range of motion.  Cardiovascular: Normal rate, regular rhythm and normal heart sounds.   Respiratory: Effort normal and breath sounds normal.  GI: Soft. Bowel sounds are normal.  Genitourinary:  Genitourinary Comments: Dilation: Closed Effacement (%):  50 Cervical Position: Posterior  Musculoskeletal: Normal range of motion.  Neurological: She is alert and oriented to person, place, and time.  Skin: Skin is warm and dry.  Psychiatric: She has a normal mood and affect. Her behavior is normal. Judgment and thought content normal.    MAU Course  Procedures  MDM NST - FHR: 140 bpm / moderate variability / accels present / decels absent TOCO: regular every 3-4.5 mins H/A Protocol Procardia Protocol for Preterm UC's  Results for orders  placed or performed during the hospital encounter of 11/14/16 (from the past 24 hour(s))  Urinalysis, Routine w reflex microscopic     Status: None   Collection Time: 11/14/16  7:13 PM  Result Value Ref Range   Color, Urine YELLOW YELLOW   APPearance CLEAR CLEAR   Specific Gravity, Urine 1.008 1.005 - 1.030   pH 6.0 5.0 - 8.0   Glucose, UA NEGATIVE NEGATIVE mg/dL   Hgb urine dipstick NEGATIVE NEGATIVE   Bilirubin Urine NEGATIVE NEGATIVE   Ketones, ur NEGATIVE NEGATIVE mg/dL   Protein, ur NEGATIVE NEGATIVE mg/dL   Nitrite NEGATIVE NEGATIVE   Leukocytes, UA NEGATIVE NEGATIVE   Report given to and care assumed by Jorje Guild, FNP @ [place], MSN, CNM 11/14/2016, 8:28 PM   Reactive NST. CTX decreased with fluids & procardia x 1 dose. Headache improved with cocktail, 9>2. Will discharge home. Patient has appt tomorrow morning with OB.  Assessment and Plan  A:  1. Pregnancy headache in third trimester   2. Braxton Hick's contraction    P: Discharge home Discussed reasons to return to MAU Keep f/u with OB  Jorje Guild, NP

## 2016-11-14 NOTE — Discharge Instructions (Signed)
Braxton Hicks Contractions °Contractions of the uterus can occur throughout pregnancy, but they are not always a sign that you are in labor. You may have practice contractions called Braxton Hicks contractions. These false labor contractions are sometimes confused with true labor. °What are Braxton Hicks contractions? °Braxton Hicks contractions are tightening movements that occur in the muscles of the uterus before labor. Unlike true labor contractions, these contractions do not result in opening (dilation) and thinning of the cervix. Toward the end of pregnancy (32-34 weeks), Braxton Hicks contractions can happen more often and may become stronger. These contractions are sometimes difficult to tell apart from true labor because they can be very uncomfortable. You should not feel embarrassed if you go to the hospital with false labor. °Sometimes, the only way to tell if you are in true labor is for your health care provider to look for changes in the cervix. The health care provider will do a physical exam and may monitor your contractions. If you are not in true labor, the exam should show that your cervix is not dilating and your water has not broken. °If there are no prenatal problems or other health problems associated with your pregnancy, it is completely safe for you to be sent home with false labor. You may continue to have Braxton Hicks contractions until you go into true labor. °How can I tell the difference between true labor and false labor? °· Differences °? False labor °? Contractions last 30-70 seconds.: Contractions are usually shorter and not as strong as true labor contractions. °? Contractions become very regular.: Contractions are usually irregular. °? Discomfort is usually felt in the top of the uterus, and it spreads to the lower abdomen and low back.: Contractions are often felt in the front of the lower abdomen and in the groin. °? Contractions do not go away with walking.: Contractions may  go away when you walk around or change positions while lying down. °? Contractions usually become more intense and increase in frequency.: Contractions get weaker and are shorter-lasting as time goes on. °? The cervix dilates and gets thinner.: The cervix usually does not dilate or become thin. °Follow these instructions at home: °· Take over-the-counter and prescription medicines only as told by your health care provider. °· Keep up with your usual exercises and follow other instructions from your health care provider. °· Eat and drink lightly if you think you are going into labor. °· If Braxton Hicks contractions are making you uncomfortable: °? Change your position from lying down or resting to walking, or change from walking to resting. °? Sit and rest in a tub of warm water. °? Drink enough fluid to keep your urine clear or pale yellow. Dehydration may cause these contractions. °? Do slow and deep breathing several times an hour. °· Keep all follow-up prenatal visits as told by your health care provider. This is important. °Contact a health care provider if: °· You have a fever. °· You have continuous pain in your abdomen. °Get help right away if: °· Your contractions become stronger, more regular, and closer together. °· You have fluid leaking or gushing from your vagina. °· You pass blood-tinged mucus (bloody show). °· You have bleeding from your vagina. °· You have low back pain that you never had before. °· You feel your baby’s head pushing down and causing pelvic pressure. °· Your baby is not moving inside you as much as it used to. °Summary °· Contractions that occur before labor are   called Braxton Hicks contractions, false labor, or practice contractions.  Braxton Hicks contractions are usually shorter, weaker, farther apart, and less regular than true labor contractions. True labor contractions usually become progressively stronger and regular and they become more frequent.  Manage discomfort from  [facility] contractions by changing position, resting in a warm bath, drinking plenty of water, or practicing deep breathing. This information is not intended to replace advice given to you by your health care provider. Make sure you discuss any questions you have with your health care provider. Document Released: 03/05/2005 Document Revised: 01/23/2016 Document Reviewed: 01/23/2016 Elsevier Interactive Patient Education  2017 [employer] General Headache Without Cause A headache is pain or discomfort felt around the head or neck area. The specific cause of a headache may not be found. There are many causes and types of headaches. A few common ones are:  Tension headaches.  Migraine headaches.  Cluster headaches.  Chronic daily headaches.  Follow these instructions at home: Watch your condition for any changes. Take these steps to help with your condition: Managing pain  Take over-the-counter and prescription medicines only as told by your health care provider.  Lie down in a dark, quiet room when you have a headache.  If directed, apply ice to the head and neck area: ? Put ice in a plastic bag. ? Place a towel between your skin and the bag. ? Leave the ice on for 20 minutes, 2-3 times per day.  Use a heating pad or hot shower to apply heat to the head and neck area as told by your health care provider.  Keep lights dim if bright lights bother you or make your headaches worse. Eating and drinking  Eat meals on a regular schedule.  Limit alcohol use.  Decrease the amount of caffeine you drink, or stop drinking caffeine. General instructions  Keep all follow-up visits as told by your health care provider. This is important.  Keep a headache journal to help find out what may trigger your headaches. For example, write down: ? What you eat and drink. ? How much sleep you get. ? Any change to your diet or medicines.  Try massage or other relaxation techniques.  Limit  stress.  Sit up straight, and do not tense your muscles.  Do not use tobacco products, including cigarettes, chewing tobacco, or e-cigarettes. If you need help quitting, ask your health care provider.  Exercise regularly as told by your health care provider.  Sleep on a regular schedule. Get 7-9 hours of sleep, or the amount recommended by your health care provider. Contact a health care provider if:  Your symptoms are not helped by medicine.  You have a headache that is different from the usual headache.  You have nausea or you vomit.  You have a fever. Get help right away if:  Your headache becomes severe.  You have repeated vomiting.  You have a stiff neck.  You have a loss of vision.  You have problems with speech.  You have pain in the eye or ear.  You have muscular weakness or loss of muscle control.  You lose your balance or have trouble walking.  You feel faint or pass out.  You have confusion. This information is not intended to replace advice given to you by your health care provider. Make sure you discuss any questions you have with your health care provider. Document Released: 03/05/2005 Document Revised: 08/11/2015 Document Reviewed: 06/28/2014 Elsevier Interactive Patient Education  2017 [employer]vier  Inc[employer] ° °

## 2016-11-14 NOTE — MAU Note (Signed)
Pt reports contractions every few minutes for several hours. Pt denies LOF or vaginal bleeding. Reports good fetal movement.

## 2016-11-15 ENCOUNTER — Ambulatory Visit (INDEPENDENT_AMBULATORY_CARE_PROVIDER_SITE_OTHER): Payer: Medicaid Other | Admitting: Obstetrics and Gynecology

## 2016-11-15 ENCOUNTER — Other Ambulatory Visit (HOSPITAL_COMMUNITY)
Admission: RE | Admit: 2016-11-15 | Discharge: 2016-11-15 | Disposition: A | Discharge status: Home / Self Care | Payer: Medicaid Other | Source: Ambulatory Visit | Attending: Obstetrics and Gynecology | Admitting: Obstetrics and Gynecology

## 2016-11-15 VITALS — BP 96/52 | HR 90 | Wt 268.4 lb

## 2016-11-15 DIAGNOSIS — O09523 Supervision of elderly multigravida, third trimester: Secondary | ICD-10-CM | POA: Diagnosis not present

## 2016-11-15 DIAGNOSIS — O0993 Supervision of high risk pregnancy, unspecified, third trimester: Secondary | ICD-10-CM | POA: Diagnosis not present

## 2016-11-15 DIAGNOSIS — Z98891 History of uterine scar from previous surgery: Secondary | ICD-10-CM

## 2016-11-15 DIAGNOSIS — O99213 Obesity complicating pregnancy, third trimester: Secondary | ICD-10-CM

## 2016-11-15 DIAGNOSIS — Z3A35 35 weeks gestation of pregnancy: Secondary | ICD-10-CM | POA: Diagnosis not present

## 2016-11-15 DIAGNOSIS — O24410 Gestational diabetes mellitus in pregnancy, diet controlled: Secondary | ICD-10-CM

## 2016-11-15 DIAGNOSIS — O09522 Supervision of elderly multigravida, second trimester: Secondary | ICD-10-CM

## 2016-11-15 DIAGNOSIS — O99210 Obesity complicating pregnancy, unspecified trimester: Secondary | ICD-10-CM

## 2016-11-15 NOTE — Progress Notes (Signed)
Prenatal Visit Note Date: 11/15/2016 Clinic: [facility]  Subjective:  Vicki Griffin is a 36 y.o. G3P2002 at 9365w3d being seen today for ongoing prenatal care.  She is currently monitored for the following issues for this high-risk pregnancy and has Supervision of high-risk pregnancy; AMA (advanced maternal age) multigravida 35+, second trimester; Late prenatal care affecting pregnancy, antepartum; History of C-section x 2; Obesity in pregnancy, antepartum; and Gestational diabetes mellitus, antepartum on her problem list.  Patient reports low back pain, occasional UCs, tired..   Contractions: Irregular. Vag. Bleeding: None.  Movement: Present. Denies leaking of fluid.   The following portions of the patient's history were reviewed and updated as appropriate: allergies, current medications, past family history, past medical history, past social history, past surgical history and problem list. Problem list updated.  Objective:   Vitals:   11/15/16 1150  BP: (!) 96/52  Pulse: 90  Weight: 268 lb 6.4 oz (121.7 kg)    Fetal Status: Fetal Heart Rate (bpm): 130s Fundal Height: 35 cm Movement: Present  Presentation: Vertex  General:  Alert, oriented and cooperative. Patient is in no acute distress.  Skin: Skin is warm and dry. No rash noted.   Cardiovascular: Normal heart rate noted  Respiratory: Normal respiratory effort, no problems with respiration noted  Abdomen: Soft, gravid, appropriate for gestational age. Pain/Pressure: Present     Pelvic:  Cervical exam performed Dilation: Closed Effacement (%): Thick Station: Ballotable  Extremities: Normal range of motion.  Edema: Mild pitting, slight indentation  Mental Status: Normal mood and affect. Normal behavior. Normal judgment and thought content.   Urinalysis:      Assessment and Plan:  Pregnancy: G3P2002 at 2665w3d  1. Diet controlled gestational diabetes mellitus (GDM), antepartum Didn't bring log but gives  normal #s. Growth u/s PRN, for u/s   2. Supervision of high risk pregnancy in third trimester BTL papers signed - Strep Gp B NAA - Cervicovaginal ancillary only  3. AMA (advanced maternal age) multigravida 35+, second trimester  4. History of C-section x 2 Rpt c/s already scheduled  5. Obesity in pregnancy, antepartum  Preterm labor symptoms and general obstetric precautions including but not limited to vaginal bleeding, contractions, leaking of fluid and fetal movement were reviewed in detail with the patient. Please refer to After Visit Summary for other counseling recommendations.  Return in about 1 week (around 11/22/2016).   [place] BingPickens, Alisyn Lequire, MD

## 2016-11-16 LAB — CERVICOVAGINAL ANCILLARY ONLY
Chlamydia: NEGATIVE
Neisseria Gonorrhea: NEGATIVE

## 2016-11-17 LAB — STREP GP B NAA: STREP GROUP B AG: POSITIVE — AB

## 2016-11-20 ENCOUNTER — Encounter: Payer: Self-pay | Admitting: Obstetrics and Gynecology

## 2016-11-20 DIAGNOSIS — O99820 Streptococcus B carrier state complicating pregnancy: Secondary | ICD-10-CM | POA: Insufficient documentation

## 2016-11-21 ENCOUNTER — Ambulatory Visit (HOSPITAL_COMMUNITY)
Admission: RE | Admit: 2016-11-21 | Discharge: 2016-11-21 | Disposition: A | Discharge status: Home / Self Care | Payer: Medicaid Other | Source: Ambulatory Visit | Attending: Family Medicine | Admitting: Family Medicine

## 2016-11-21 ENCOUNTER — Other Ambulatory Visit: Payer: Self-pay | Admitting: Family Medicine

## 2016-11-21 DIAGNOSIS — E669 Obesity, unspecified: Secondary | ICD-10-CM | POA: Diagnosis not present

## 2016-11-21 DIAGNOSIS — O24410 Gestational diabetes mellitus in pregnancy, diet controlled: Secondary | ICD-10-CM | POA: Diagnosis not present

## 2016-11-21 DIAGNOSIS — Z3A36 36 weeks gestation of pregnancy: Secondary | ICD-10-CM | POA: Diagnosis not present

## 2016-11-21 DIAGNOSIS — O99210 Obesity complicating pregnancy, unspecified trimester: Secondary | ICD-10-CM

## 2016-11-21 DIAGNOSIS — O09523 Supervision of elderly multigravida, third trimester: Secondary | ICD-10-CM | POA: Insufficient documentation

## 2016-11-21 DIAGNOSIS — O99213 Obesity complicating pregnancy, third trimester: Secondary | ICD-10-CM | POA: Diagnosis present

## 2016-11-23 ENCOUNTER — Ambulatory Visit (INDEPENDENT_AMBULATORY_CARE_PROVIDER_SITE_OTHER): Payer: Medicaid Other | Admitting: Obstetrics & Gynecology

## 2016-11-23 VITALS — BP 119/70 | HR 82 | Wt 266.0 lb

## 2016-11-23 DIAGNOSIS — O24410 Gestational diabetes mellitus in pregnancy, diet controlled: Secondary | ICD-10-CM

## 2016-11-23 DIAGNOSIS — O0993 Supervision of high risk pregnancy, unspecified, third trimester: Secondary | ICD-10-CM

## 2016-11-23 DIAGNOSIS — Z98891 History of uterine scar from previous surgery: Secondary | ICD-10-CM

## 2016-11-23 NOTE — Progress Notes (Signed)
PRENATAL VISIT NOTE  Subjective:  Vicki Griffin is a 36 y.o. G3P2002 at [redacted]w[redacted]d being seen today for ongoing prenatal care.  She is currently monitored for the following issues for this high-risk pregnancy and has Supervision of high-risk pregnancy; AMA (advanced maternal age) multigravida 35+, second trimester; Late prenatal care affecting pregnancy, antepartum; History of C-section x 2; Obesity in pregnancy, antepartum; Gestational diabetes mellitus, antepartum; and GBS (group B Streptococcus carrier), +RV culture, currently pregnant on her problem list.  Patient reports occasional contractions.  Contractions: Irregular. Vag. Bleeding: None.  Movement: Present. Denies leaking of fluid.   The following portions of the patient's history were reviewed and updated as appropriate: allergies, current medications, past family history, past medical history, past social history, past surgical history and problem list. Problem list updated.  Objective:   Vitals:   11/23/16 1055  BP: 119/70  Pulse: 82  Weight: 266 lb (120.7 kg)    Fetal Status: Fetal Heart Rate (bpm): 135 Fundal Height: 36 cm Movement: Present  Presentation: Vertex  General:  Alert, oriented and cooperative. Patient is in no acute distress.  Skin: Skin is warm and dry. No rash noted.   Cardiovascular: Normal heart rate noted  Respiratory: Normal respiratory effort, no problems with respiration noted  Abdomen: Soft, gravid, appropriate for gestational age.  Pain/Pressure: Present     Pelvic: Cervical exam performed Dilation: 1 Effacement (%): Thick Station: Ballotable  Extremities: Normal range of motion.  Edema: Mild pitting, slight indentation  Mental Status:  Normal mood and affect. Normal behavior. Normal judgment and thought content.   [place] Mfm Ob Follow Up  Result Date: 11/21/2016 ----------------------------------------------------------------------  OBSTETRICS REPORT                      (Signed Final 11/21/2016  12:25 pm) ---------------------------------------------------------------------- Patient Info  ID #:       [id number]                         D.O.B.:   1980/08/14 (35 yrs)  Name:       Vicki Griffin             Visit Date:  11/21/2016 11:05 am ---------------------------------------------------------------------- Performed By  Performed By:     Percell Boston          Secondary Phy.:   [facility] for                                                             [facility]  Attending:        Charlsie Merles MD         Address:          [address]                                                             [address]  Referred By:      Isa Rankin         Location:         [facility] MD  Ref. Address:     [address]                    [address] ---------------------------------------------------------------------- Orders   #  Description                                 Code   1  [place] MFM OB FOLLOW UP                         [id number]  ----------------------------------------------------------------------   #  Ordered By               Order #        Accession #    Episode #   1  Lyndel Safe          [id number]      [id number]     [id number]  ---------------------------------------------------------------------- Indications   [redacted] weeks gestation of pregnancy                Z3A.74   Advanced maternal age multigravida 24+,        O53.522   second trimester   Late prenatal care, second trimester           O09.32   History of cesarean delivery, currently        O34.219   pregnant   Obesity complicating pregnancy, second         O99.212   trimester   Poor obstetric history (prior pre-term labor)  O09.219   Family history of congenital anomaly (?        Z82.79   unilateral club foot of previous child)   Gestational diabetes in pregnancy,  diet        O24.410   controlled  ---------------------------------------------------------------------- OB History  Blood Type:            Height:  5'2"   Weight (lb):  247      BMI:   45.17  Gravidity:    3         Term:   2        Prem:   0        SAB:   0  TOP:          0       Ectopic:  0        Living: 2 ---------------------------------------------------------------------- Fetal Evaluation  Num Of Fetuses:     1  Fetal Heart         140  Rate(bpm):  Cardiac  Activity:   Observed  Presentation:       Cephalic  Placenta:           Anterior, above cervical os  P. Cord Insertion:  Visualized, central  Amniotic Fluid  AFI FV:      Subjectively within normal limits  AFI Sum(cm)     %Tile       Largest Pocket(cm)  10.24           24          3.7  RUQ(cm)       RLQ(cm)       LUQ(cm)        LLQ(cm)  1.41          3.7           3.63           1.5 ---------------------------------------------------------------------- Biometry  BPD:      92.2  mm     G. Age:  37w 3d         87  %    CI:        84.93   %   70 - 86                                                          FL/HC:      23.6   %   20.1 - 22.1  HC:      315.2  mm     G. Age:  35w 2d          8  %    HC/AC:      0.93       0.93 - 1.11  AC:      340.1  mm     G. Age:  37w 6d         93  %    FL/BPD:     80.7   %   71 - 87  FL:       74.4  mm     G. Age:  38w 1d         86  %    FL/AC:      21.9   %   20 - 24  HUM:        63  mm     G. Age:  36w 4d         69  %  Est. FW:    3254  gm      7 lb 3 oz     86  % ---------------------------------------------------------------------- Gestational Age  LMP:           36w 2d       Date:   03/12/16                 EDD:   12/17/16  U/S Today:     37w 1d                                        EDD:   12/11/16  Best:          36w 2d    Det. By:   LMP  (03/12/16)  EDD:   12/17/16 ---------------------------------------------------------------------- Anatomy  Cranium:               Appears normal         Aortic Arch:             Previously seen  Cavum:                 Previously seen        Ductal Arch:            Previously seen  Ventricles:            Previously seen        Diaphragm:              Previously seen  Choroid Plexus:        Previously seen        Stomach:                Appears normal, left                                                                        sided  Cerebellum:            Previously seen        Abdomen:                Previously seen  Posterior Fossa:       Previously seen        Abdominal Wall:         Previously seen  Nuchal Fold:           Not applicable (>20    Cord Vessels:           Previously seen                         wks GA)  Face:                  Orbits and profile     Kidneys:                Previously seen                         previously seen  Lips:                  Previously seen        Bladder:                Appears normal  Thoracic:              Previously seen        Spine:                  Previously seen  Heart:                 Previously seen        Upper Extremities:      Previously seen  RVOT:                  Previously seen        Lower Extremities:      Previously seen  LVOT:                  Previously seen  Other:  Female gender previously seen. Heels and 5th digit previously seen.          Nasal bone previously seen. Technically difficult due to maternal          habitus and fetal position. ---------------------------------------------------------------------- Cervix Uterus Adnexa  Cervix  Not visualized (advanced GA >29wks)  Uterus  No abnormality visualized.  Left Ovary  No adnexal mass visualized.  Right Ovary  No adnexal mass visualized.  Cul De Sac:   No free fluid seen.  Adnexa:       No abnormality visualized. ---------------------------------------------------------------------- Impression  Singleton intrauterine pregnancy at 36+2 weeks with GDM  and AMA  Review of the anatomy shows no sonographic markers for  aneuploidy or structural anomalies  Amniotic fluid  volume is normal  Estimated fetal weight is 3254g which is growth in the 86th  percentile ---------------------------------------------------------------------- Recommendations  Consider repeat scan for growth if undelivered in 3 weeks ----------------------------------------------------------------------                 Charlsie Merles, MD Electronically Signed Final Report   11/21/2016 12:25 pm ----------------------------------------------------------------------   Assessment and Plan:  Pregnancy: [id number] at [redacted]w[redacted]d  1. Diet controlled gestational diabetes mellitus (GDM), antepartum Did not bring log, reports normal BS. Encouraged to bring next visit. Continue diet and exercise. Ultrasound results reviewed. Patient really wants to be delivered next week. She was told there was no current indication for this; she maintains she was delivered by 37 weeks the last two pregnancies in [place] and never went into labor. She was told that this does not occur here in the [place]; we cannot deliver her early without an indication. She was not happy.   2. History of C-section x 2 Scheduled for RCS.  3. Supervision of high risk pregnancy in third trimester Labor symptoms and general obstetric precautions including but not limited to vaginal bleeding, contractions, leaking of fluid and fetal movement were reviewed in detail with the patient. Please refer to After Visit Summary for other counseling recommendations.  Return in about 1 week (around 11/30/2016) for OB Visit.   Jaynie Collins, MD

## 2016-11-23 NOTE — Patient Instructions (Signed)
Return to clinic for any scheduled appointments or obstetric concerns, or go to [facility] for evaluation  

## 2016-11-25 NOTE — Progress Notes (Signed)
Estimated fetal weight is 3254g which is growth in the 86th percentile  Needs F/U Growth scan in 2 weeks if not delivered

## 2016-11-27 ENCOUNTER — Ambulatory Visit (INDEPENDENT_AMBULATORY_CARE_PROVIDER_SITE_OTHER): Payer: Medicaid Other | Admitting: Obstetrics and Gynecology

## 2016-11-27 VITALS — BP 105/68 | HR 83 | Wt 265.0 lb

## 2016-11-27 DIAGNOSIS — O24410 Gestational diabetes mellitus in pregnancy, diet controlled: Secondary | ICD-10-CM

## 2016-11-27 DIAGNOSIS — O09523 Supervision of elderly multigravida, third trimester: Secondary | ICD-10-CM

## 2016-11-27 DIAGNOSIS — O0993 Supervision of high risk pregnancy, unspecified, third trimester: Secondary | ICD-10-CM

## 2016-11-27 DIAGNOSIS — O99820 Streptococcus B carrier state complicating pregnancy: Secondary | ICD-10-CM

## 2016-11-27 DIAGNOSIS — O09522 Supervision of elderly multigravida, second trimester: Secondary | ICD-10-CM

## 2016-11-27 DIAGNOSIS — Z98891 History of uterine scar from previous surgery: Secondary | ICD-10-CM

## 2016-11-27 MED ORDER — GLYBURIDE 1.25 MG PO TABS: ORAL_TABLET | ORAL | 1 refills | Status: DC

## 2016-11-27 NOTE — Addendum Note (Signed)
Addended by: Arne ClevelandHUTCHINSON, MANDY J on: 11/27/2016 11:50 AM   Modules accepted: Orders

## 2016-11-27 NOTE — Progress Notes (Signed)
Prenatal Visit Note Date: 11/27/2016 Clinic: [facility]  Subjective:  Vicki Griffin is a 36 y.o. G3P2002 at 8458w1d being seen today for ongoing prenatal care.  She is currently monitored for the following issues for this high-risk pregnancy and has Supervision of high-risk pregnancy; AMA (advanced maternal age) multigravida 35+, second trimester; Late prenatal care affecting pregnancy, antepartum; History of C-section x 2; Obesity in pregnancy, antepartum; Gestational diabetes mellitus, antepartum; and GBS (group B Streptococcus carrier), +RV culture, currently pregnant on her problem list.  Patient reports low back pain, tired.   Contractions: Irregular. Vag. Bleeding: None.  Movement: Present. Denies leaking of fluid.   The following portions of the patient's history were reviewed and updated as appropriate: allergies, current medications, past family history, past medical history, past social history, past surgical history and problem list. Problem list updated.  Objective:   Vitals:   11/27/16 1031  BP: 105/68  Pulse: 83  Weight: 265 lb (120.2 kg)    Fetal Status: Fetal Heart Rate (bpm): 140 Fundal Height: 37 cm Movement: Present     General:  Alert, oriented and cooperative. Patient is in no acute distress.  Skin: Skin is warm and dry. No rash noted.   Cardiovascular: Normal heart rate noted  Respiratory: Normal respiratory effort, no problems with respiration noted  Abdomen: Soft, gravid, appropriate for gestational age. Pain/Pressure: Present     Pelvic:  Cervical exam deferred        Extremities: Normal range of motion.  Edema: Mild pitting, slight indentation  Mental Status: Normal mood and affect. Normal behavior. Normal judgment and thought content.   Urinalysis:      Assessment and Plan:  Pregnancy: G3P2002 at 5558w1d  1. Diet controlled gestational diabetes mellitus (GDM), antepartum AM 100s-110s 2h post prandials Breakfast and lunch  130s-140s Dinner 120s  Recommend starting glyburide which she is amenable to. Will do glyb 1.25 with breakfast and qhs. Pt to check 0200-0300 BS log.   rNST today and normal AFI. Rpt nst later this week.  9/5: ceph, AFI 10.2, efw 3254 86% with AC @ 93%  2. Supervision of high risk pregnancy in third trimester No BTL  3. AMA (advanced maternal age) multigravida 35+, second trimester  4. History of C-section x 2 Scheduled for rpt already  5. GBS (group B Streptococcus carrier), +RV culture, currently pregnant tx in labor or ROM  Term labor symptoms and general obstetric precautions including but not limited to vaginal bleeding, contractions, leaking of fluid and fetal movement were reviewed in detail with the patient. Please refer to After Visit Summary for other counseling recommendations.  Return in about 3 days (around 11/30/2016) for for NST only visit. 1wk nst/afi/rob.   Matawan BingPickens, Tianni Escamilla, MD

## 2016-11-30 ENCOUNTER — Ambulatory Visit (INDEPENDENT_AMBULATORY_CARE_PROVIDER_SITE_OTHER): Payer: Medicaid Other | Admitting: *Deleted

## 2016-11-30 VITALS — BP 106/55 | HR 87 | Wt 267.0 lb

## 2016-11-30 DIAGNOSIS — O24410 Gestational diabetes mellitus in pregnancy, diet controlled: Secondary | ICD-10-CM

## 2016-11-30 DIAGNOSIS — O0993 Supervision of high risk pregnancy, unspecified, third trimester: Secondary | ICD-10-CM

## 2016-11-30 NOTE — Progress Notes (Signed)
NST:  Baseline: 145 bpm, Variability: Good {> 6 bpm), Accelerations: Reactive and Decelerations: Absent    

## 2016-12-03 ENCOUNTER — Telehealth (HOSPITAL_COMMUNITY): Payer: Self-pay | Admitting: *Deleted

## 2016-12-03 NOTE — Telephone Encounter (Signed)
Preadmission screen  

## 2016-12-04 ENCOUNTER — Encounter (HOSPITAL_COMMUNITY): Payer: Self-pay

## 2016-12-05 ENCOUNTER — Ambulatory Visit (INDEPENDENT_AMBULATORY_CARE_PROVIDER_SITE_OTHER): Payer: Medicaid Other | Admitting: Obstetrics and Gynecology

## 2016-12-05 VITALS — BP 117/55 | HR 87 | Wt 267.0 lb

## 2016-12-05 DIAGNOSIS — O0993 Supervision of high risk pregnancy, unspecified, third trimester: Secondary | ICD-10-CM

## 2016-12-05 DIAGNOSIS — O09522 Supervision of elderly multigravida, second trimester: Secondary | ICD-10-CM

## 2016-12-05 DIAGNOSIS — O24415 Gestational diabetes mellitus in pregnancy, controlled by oral hypoglycemic drugs: Secondary | ICD-10-CM | POA: Diagnosis not present

## 2016-12-05 DIAGNOSIS — O99820 Streptococcus B carrier state complicating pregnancy: Secondary | ICD-10-CM

## 2016-12-05 DIAGNOSIS — Z98891 History of uterine scar from previous surgery: Secondary | ICD-10-CM

## 2016-12-05 NOTE — Progress Notes (Signed)
   PRENATAL VISIT NOTE  Subjective:  Frona Yost is a 36 y.o. G3P2002 at [redacted]w[redacted]d being seen today for ongoing prenatal care.  She is currently monitored for the following issues for this high-risk pregnancy and has Supervision of high-risk pregnancy; AMA (advanced maternal age) multigravida 35+, second trimester; Late prenatal care affecting pregnancy, antepartum; History of C-section x 2; Obesity in pregnancy, antepartum; Gestational diabetes mellitus, antepartum; and GBS (group B Streptococcus carrier), +RV culture, currently pregnant on her problem list.  Patient reports no complaints.  Contractions: Irregular. Vag. Bleeding: None.  Movement: Present. Denies leaking of fluid.   The following portions of the patient's history were reviewed and updated as appropriate: allergies, current medications, past family history, past medical history, past social history, past surgical history and problem list. Problem list updated.  Objective:   Vitals:   12/05/16 1504  BP: (!) 117/55  Pulse: 87  Weight: 267 lb (121.1 kg)    Fetal Status: Fetal Heart Rate (bpm): NST   Movement: Present     General:  Alert, oriented and cooperative. Patient is in no acute distress.  Skin: Skin is warm and dry. No rash noted.   Cardiovascular: Normal heart rate noted  Respiratory: Normal respiratory effort, no problems with respiration noted  Abdomen: Soft, gravid, appropriate for gestational age.  Pain/Pressure: Present     Pelvic: Cervical exam deferred        Extremities: Normal range of motion.  Edema: Mild pitting, slight indentation  Mental Status:  Normal mood and affect. Normal behavior. Normal judgment and thought content.   Assessment and Plan:  Pregnancy: G3P2002 at [redacted]w[redacted]d  1. Supervision of high risk pregnancy in third trimester Patient is doing well without complaints  2. GBS (group B Streptococcus carrier), +RV culture, currently pregnant Patient scheduled for repeat c-section  3.  History of C-section x 2 C-section scheduled on 9/24. Patient declined BTL  4. AMA (advanced maternal age) multigravida 35+, second trimester   5. Gestational diabetes mellitus (GDM) controlled on oral hypoglycemic drug, antepartum Patient currently on glyburide 1.25 BID All values within range without any significant hypoglycemia NST reviewed and reactive with baseline 140, mod variability, + accels, no decels  Term labor symptoms and general obstetric precautions including but not limited to vaginal bleeding, contractions, leaking of fluid and fetal movement were reviewed in detail with the patient. Please refer to After Visit Summary for other counseling recommendations.  Return in about 6 weeks (around 01/16/2017) for postpartum visit and glucola.   Catalina Antigua, MD

## 2016-12-05 NOTE — Addendum Note (Signed)
Addended by: Arne Cleveland on: 12/05/2016 03:52 PM   Modules accepted: Orders

## 2016-12-07 ENCOUNTER — Encounter (HOSPITAL_COMMUNITY)
Admission: RE | Admit: 2016-12-07 | Discharge: 2016-12-07 | Disposition: A | Discharge status: Home / Self Care | Payer: Medicaid Other | Source: Ambulatory Visit | Attending: Family Medicine | Admitting: Family Medicine

## 2016-12-07 DIAGNOSIS — Z01812 Encounter for preprocedural laboratory examination: Secondary | ICD-10-CM

## 2016-12-07 HISTORY — DX: Gestational diabetes mellitus in pregnancy, unspecified control: O24.419

## 2016-12-07 LAB — CBC
HCT: 34.7 % — ABNORMAL LOW (ref 36.0–46.0)
Hemoglobin: 11.7 g/dL — ABNORMAL LOW (ref 12.0–15.0)
MCH: 29.8 pg (ref 26.0–34.0)
MCHC: 33.7 g/dL (ref 30.0–36.0)
MCV: 88.5 fL (ref 78.0–100.0)
PLATELETS: 219 10*3/uL (ref 150–400)
RBC: 3.92 MIL/uL (ref 3.87–5.11)
RDW: 14.3 % (ref 11.5–15.5)
WBC: 7.3 10*3/uL (ref 4.0–10.5)

## 2016-12-07 LAB — TYPE AND SCREEN
ABO/RH(D): O POS
Antibody Screen: NEGATIVE

## 2016-12-07 LAB — ABO/RH: ABO/RH(D): O POS

## 2016-12-07 NOTE — Patient Instructions (Signed)
20 Kyana Andreen  12/07/2016   Your procedure is scheduled on:  12/10/2016  Enter through the Main Entrance of [facility] at 0930 AM.  Pick up the phone at the desk and dial [phone].   Call this number if you have problems the morning of surgery: [phone]   Remember:   Do not eat food:After Midnight.  Do not drink clear liquids: After Midnight.  Take these medicines the morning of surgery with A SIP OF WATER: none   Do not wear jewelry, make-up or nail polish.  Do not wear lotions, powders, or perfumes. Do not wear deodorant.  Do not shave 48 hours prior to surgery.  Do not bring valuables to the hospital.  [facility] is not   responsible for any belongings or valuables brought to the hospital.  Contacts, dentures or bridgework may not be worn into surgery.  Leave suitcase in the car. After surgery it may be brought to your room.  For patients admitted to the hospital, checkout time is 11:00 AM the day of              discharge.   Patients discharged the day of surgery will not be allowed to drive             home.  Name and phone number of your driver: na  Special Instructions:   N/A   Please read over the following fact sheets that you were given:   Surgical Site Infection Prevention

## 2016-12-08 LAB — RPR: RPR Ser Ql: NONREACTIVE

## 2016-12-10 ENCOUNTER — Inpatient Hospital Stay (HOSPITAL_COMMUNITY): Payer: Medicaid Other | Admitting: Anesthesiology

## 2016-12-10 ENCOUNTER — Telehealth: Payer: Self-pay | Admitting: Radiology

## 2016-12-10 ENCOUNTER — Inpatient Hospital Stay (HOSPITAL_COMMUNITY)
Admission: RE | Admit: 2016-12-10 | Discharge: 2016-12-12 | DRG: 765 | Disposition: A | Discharge status: Home / Self Care | Payer: Medicaid Other | Source: Ambulatory Visit | Attending: Family Medicine | Admitting: Family Medicine

## 2016-12-10 ENCOUNTER — Encounter (HOSPITAL_COMMUNITY): Payer: Self-pay | Admitting: *Deleted

## 2016-12-10 ENCOUNTER — Encounter (HOSPITAL_COMMUNITY)
Admission: RE | Disposition: A | Discharge status: Home / Self Care | Payer: Self-pay | Source: Ambulatory Visit | Attending: Family Medicine

## 2016-12-10 DIAGNOSIS — Z6841 Body Mass Index (BMI) 40.0 and over, adult: Secondary | ICD-10-CM | POA: Diagnosis not present

## 2016-12-10 DIAGNOSIS — O99214 Obesity complicating childbirth: Secondary | ICD-10-CM | POA: Diagnosis present

## 2016-12-10 DIAGNOSIS — Z3A39 39 weeks gestation of pregnancy: Secondary | ICD-10-CM

## 2016-12-10 DIAGNOSIS — O99824 Streptococcus B carrier state complicating childbirth: Secondary | ICD-10-CM | POA: Diagnosis present

## 2016-12-10 DIAGNOSIS — O24425 Gestational diabetes mellitus in childbirth, controlled by oral hypoglycemic drugs: Secondary | ICD-10-CM | POA: Diagnosis present

## 2016-12-10 DIAGNOSIS — Z8632 Personal history of gestational diabetes: Secondary | ICD-10-CM | POA: Diagnosis present

## 2016-12-10 DIAGNOSIS — O9902 Anemia complicating childbirth: Secondary | ICD-10-CM | POA: Diagnosis present

## 2016-12-10 DIAGNOSIS — D649 Anemia, unspecified: Secondary | ICD-10-CM | POA: Diagnosis present

## 2016-12-10 DIAGNOSIS — Z98891 History of uterine scar from previous surgery: Secondary | ICD-10-CM

## 2016-12-10 DIAGNOSIS — O34211 Maternal care for low transverse scar from previous cesarean delivery: Secondary | ICD-10-CM | POA: Diagnosis present

## 2016-12-10 DIAGNOSIS — O24429 Gestational diabetes mellitus in childbirth, unspecified control: Secondary | ICD-10-CM | POA: Diagnosis not present

## 2016-12-10 LAB — GLUCOSE, CAPILLARY: GLUCOSE-CAPILLARY: 73 mg/dL (ref 65–99)

## 2016-12-10 LAB — CREATININE, SERUM: CREATININE: 0.57 mg/dL (ref 0.44–1.00)

## 2016-12-10 SURGERY — Surgical Case
Anesthesia: Spinal | Site: Abdomen | Laterality: Bilateral | Wound class: Clean Contaminated

## 2016-12-10 MED ORDER — KETOROLAC TROMETHAMINE 30 MG/ML IJ SOLN
INTRAMUSCULAR | Status: AC
  Filled 2016-12-10: qty 1

## 2016-12-10 MED ORDER — SODIUM CHLORIDE 0.9% FLUSH: 3.0000 mL | INTRAVENOUS | Status: DC | PRN

## 2016-12-10 MED ORDER — OXYTOCIN 40 UNITS IN LACTATED RINGERS INFUSION - SIMPLE MED: 2.5000 [IU]/h | INTRAVENOUS | Status: AC

## 2016-12-10 MED ORDER — KETOROLAC TROMETHAMINE 30 MG/ML IJ SOLN
30.0000 mg | Freq: Four times a day (QID) | INTRAMUSCULAR | Status: AC | PRN
  Administered 2016-12-10: 30 mg via INTRAMUSCULAR

## 2016-12-10 MED ORDER — BUPIVACAINE HCL (PF) 0.25 % IJ SOLN
INTRAMUSCULAR | Status: DC | PRN
  Administered 2016-12-10: 30 mL

## 2016-12-10 MED ORDER — KETOROLAC TROMETHAMINE 30 MG/ML IJ SOLN: 30.0000 mg | Freq: Four times a day (QID) | INTRAMUSCULAR | Status: AC | PRN

## 2016-12-10 MED ORDER — IBUPROFEN 600 MG PO TABS
600.0000 mg | ORAL_TABLET | Freq: Four times a day (QID) | ORAL | Status: DC
  Administered 2016-12-11 – 2016-12-12 (×7): 600 mg via ORAL
  Filled 2016-12-10 (×8): qty 1

## 2016-12-10 MED ORDER — DEXAMETHASONE SODIUM PHOSPHATE 4 MG/ML IJ SOLN
INTRAMUSCULAR | Status: DC | PRN
  Administered 2016-12-10: 4 mg via INTRAVENOUS

## 2016-12-10 MED ORDER — ONDANSETRON HCL 4 MG/2ML IJ SOLN
INTRAMUSCULAR | Status: DC | PRN
  Administered 2016-12-10: 4 mg via INTRAVENOUS

## 2016-12-10 MED ORDER — OXYCODONE HCL 5 MG PO TABS
5.0000 mg | ORAL_TABLET | ORAL | Status: DC | PRN
  Administered 2016-12-11 – 2016-12-12 (×3): 5 mg via ORAL
  Filled 2016-12-10 (×3): qty 1

## 2016-12-10 MED ORDER — OXYTOCIN 10 UNIT/ML IJ SOLN
INTRAMUSCULAR | Status: AC
  Filled 2016-12-10: qty 4

## 2016-12-10 MED ORDER — BUPIVACAINE HCL (PF) 0.25 % IJ SOLN
INTRAMUSCULAR | Status: AC
  Filled 2016-12-10: qty 30

## 2016-12-10 MED ORDER — MENTHOL 3 MG MT LOZG: 1.0000 | LOZENGE | OROMUCOSAL | Status: DC | PRN

## 2016-12-10 MED ORDER — ZOLPIDEM TARTRATE 5 MG PO TABS: 5.0000 mg | ORAL_TABLET | Freq: Every evening | ORAL | Status: DC | PRN

## 2016-12-10 MED ORDER — DIPHENHYDRAMINE HCL 50 MG/ML IJ SOLN: 12.5000 mg | INTRAMUSCULAR | Status: DC | PRN

## 2016-12-10 MED ORDER — FENTANYL CITRATE (PF) 100 MCG/2ML IJ SOLN: 25.0000 ug | INTRAMUSCULAR | Status: DC | PRN

## 2016-12-10 MED ORDER — NALBUPHINE HCL 10 MG/ML IJ SOLN: 5.0000 mg | Freq: Once | INTRAMUSCULAR | Status: DC | PRN

## 2016-12-10 MED ORDER — MEASLES, MUMPS & RUBELLA VAC ~~LOC~~ INJ: 0.5000 mL | INJECTION | Freq: Once | SUBCUTANEOUS | Status: DC

## 2016-12-10 MED ORDER — ONDANSETRON HCL 4 MG/2ML IJ SOLN
INTRAMUSCULAR | Status: AC
  Filled 2016-12-10: qty 2

## 2016-12-10 MED ORDER — SCOPOLAMINE 1 MG/3DAYS TD PT72
1.0000 | MEDICATED_PATCH | Freq: Once | TRANSDERMAL | Status: DC
  Administered 2016-12-10: 1.5 mg via TRANSDERMAL
  Filled 2016-12-10: qty 1

## 2016-12-10 MED ORDER — ACETAMINOPHEN 500 MG PO TABS
1000.0000 mg | ORAL_TABLET | Freq: Four times a day (QID) | ORAL | Status: AC
  Administered 2016-12-10 – 2016-12-11 (×4): 1000 mg via ORAL
  Filled 2016-12-10 (×4): qty 2

## 2016-12-10 MED ORDER — FENTANYL CITRATE (PF) 100 MCG/2ML IJ SOLN
INTRAMUSCULAR | Status: AC
Start: 2016-12-10 — End: ?
  Filled 2016-12-10: qty 2

## 2016-12-10 MED ORDER — MEPERIDINE HCL 25 MG/ML IJ SOLN
6.2500 mg | INTRAMUSCULAR | Status: DC | PRN
Start: 2016-12-10 — End: 2016-12-10

## 2016-12-10 MED ORDER — FAMOTIDINE 20 MG PO TABS
20.0000 mg | ORAL_TABLET | Freq: Once | ORAL | Status: AC
  Administered 2016-12-10: 20 mg via ORAL
  Filled 2016-12-10: qty 1

## 2016-12-10 MED ORDER — DEXAMETHASONE SODIUM PHOSPHATE 4 MG/ML IJ SOLN
INTRAMUSCULAR | Status: AC
  Filled 2016-12-10: qty 1

## 2016-12-10 MED ORDER — SIMETHICONE 80 MG PO CHEW
80.0000 mg | CHEWABLE_TABLET | ORAL | Status: DC | PRN
  Administered 2016-12-12: 80 mg via ORAL

## 2016-12-10 MED ORDER — DIPHENHYDRAMINE HCL 25 MG PO CAPS: 25.0000 mg | ORAL_CAPSULE | ORAL | Status: DC | PRN

## 2016-12-10 MED ORDER — WITCH HAZEL-GLYCERIN EX PADS: 1.0000 "application " | MEDICATED_PAD | CUTANEOUS | Status: DC | PRN

## 2016-12-10 MED ORDER — PHENYLEPHRINE 8 MG IN D5W 100 ML (0.08MG/ML) PREMIX OPTIME
INJECTION | INTRAVENOUS | Status: DC | PRN
  Administered 2016-12-10: 60 ug/min via INTRAVENOUS

## 2016-12-10 MED ORDER — NALOXONE HCL 0.4 MG/ML IJ SOLN: 0.4000 mg | INTRAMUSCULAR | Status: DC | PRN

## 2016-12-10 MED ORDER — SENNOSIDES-DOCUSATE SODIUM 8.6-50 MG PO TABS
2.0000 | ORAL_TABLET | ORAL | Status: DC
  Administered 2016-12-11 – 2016-12-12 (×2): 2 via ORAL
  Filled 2016-12-10 (×2): qty 2

## 2016-12-10 MED ORDER — LACTATED RINGERS IV SOLN
INTRAVENOUS | Status: DC
  Administered 2016-12-10: 11:00:00 via INTRAVENOUS

## 2016-12-10 MED ORDER — DEXTROSE 5 % IV SOLN
3.0000 g | INTRAVENOUS | Status: AC
  Administered 2016-12-10: 3 g via INTRAVENOUS
  Filled 2016-12-10: qty 3

## 2016-12-10 MED ORDER — FENTANYL CITRATE (PF) 100 MCG/2ML IJ SOLN
INTRAMUSCULAR | Status: DC | PRN
  Administered 2016-12-10: 40 ug via INTRAVENOUS
  Administered 2016-12-10: 10 ug via INTRATHECAL
  Administered 2016-12-10: 50 ug via INTRAVENOUS

## 2016-12-10 MED ORDER — CEFAZOLIN SODIUM-DEXTROSE 2-4 GM/100ML-% IV SOLN: 2.0000 g | INTRAVENOUS | Status: DC

## 2016-12-10 MED ORDER — SOD CITRATE-CITRIC ACID 500-334 MG/5ML PO SOLN
30.0000 mL | Freq: Once | ORAL | Status: AC
  Administered 2016-12-10: 30 mL via ORAL
  Filled 2016-12-10: qty 15

## 2016-12-10 MED ORDER — ACETAMINOPHEN 325 MG PO TABS
650.0000 mg | ORAL_TABLET | ORAL | Status: DC | PRN
  Administered 2016-12-12: 650 mg via ORAL
  Filled 2016-12-10: qty 2

## 2016-12-10 MED ORDER — DIBUCAINE 1 % RE OINT: 1.0000 "application " | TOPICAL_OINTMENT | RECTAL | Status: DC | PRN

## 2016-12-10 MED ORDER — PHENYLEPHRINE 8 MG IN D5W 100 ML (0.08MG/ML) PREMIX OPTIME
INJECTION | INTRAVENOUS | Status: AC
  Filled 2016-12-10: qty 100

## 2016-12-10 MED ORDER — SIMETHICONE 80 MG PO CHEW
80.0000 mg | CHEWABLE_TABLET | Freq: Three times a day (TID) | ORAL | Status: DC
  Administered 2016-12-10 – 2016-12-12 (×6): 80 mg via ORAL
  Filled 2016-12-10 (×6): qty 1

## 2016-12-10 MED ORDER — ONDANSETRON HCL 4 MG/2ML IJ SOLN: 4.0000 mg | Freq: Three times a day (TID) | INTRAMUSCULAR | Status: DC | PRN

## 2016-12-10 MED ORDER — TETANUS-DIPHTH-ACELL PERTUSSIS 5-2.5-18.5 LF-MCG/0.5 IM SUSP: 0.5000 mL | Freq: Once | INTRAMUSCULAR | Status: DC

## 2016-12-10 MED ORDER — MORPHINE SULFATE (PF) 0.5 MG/ML IJ SOLN
INTRAMUSCULAR | Status: AC
  Filled 2016-12-10: qty 10

## 2016-12-10 MED ORDER — DIPHENHYDRAMINE HCL 25 MG PO CAPS: 25.0000 mg | ORAL_CAPSULE | Freq: Four times a day (QID) | ORAL | Status: DC | PRN

## 2016-12-10 MED ORDER — SIMETHICONE 80 MG PO CHEW
80.0000 mg | CHEWABLE_TABLET | ORAL | Status: DC
  Administered 2016-12-11 – 2016-12-12 (×2): 80 mg via ORAL
  Filled 2016-12-10 (×2): qty 1

## 2016-12-10 MED ORDER — PRENATAL MULTIVITAMIN CH
1.0000 | ORAL_TABLET | Freq: Every day | ORAL | Status: DC
  Administered 2016-12-11 – 2016-12-12 (×2): 1 via ORAL
  Filled 2016-12-10: qty 1

## 2016-12-10 MED ORDER — NALBUPHINE HCL 10 MG/ML IJ SOLN: 5.0000 mg | INTRAMUSCULAR | Status: DC | PRN

## 2016-12-10 MED ORDER — DEXTROSE IN LACTATED RINGERS 5 % IV SOLN
INTRAVENOUS | Status: DC
  Administered 2016-12-11 (×2): via INTRAVENOUS

## 2016-12-10 MED ORDER — SODIUM CHLORIDE 0.9 % IR SOLN
Status: DC | PRN
  Administered 2016-12-10: 1

## 2016-12-10 MED ORDER — PROMETHAZINE HCL 25 MG/ML IJ SOLN: 6.2500 mg | INTRAMUSCULAR | Status: DC | PRN

## 2016-12-10 MED ORDER — MORPHINE SULFATE (PF) 0.5 MG/ML IJ SOLN
INTRAMUSCULAR | Status: DC | PRN
  Administered 2016-12-10: .2 mg via INTRATHECAL
  Administered 2016-12-10: 4.8 mg via INTRAVENOUS

## 2016-12-10 MED ORDER — BUPIVACAINE IN DEXTROSE 0.75-8.25 % IT SOLN
INTRATHECAL | Status: DC | PRN
  Administered 2016-12-10: 1.6 mL via INTRATHECAL

## 2016-12-10 MED ORDER — ENOXAPARIN SODIUM 40 MG/0.4ML ~~LOC~~ SOLN
40.0000 mg | SUBCUTANEOUS | Status: DC
  Administered 2016-12-11 – 2016-12-12 (×2): 40 mg via SUBCUTANEOUS
  Filled 2016-12-10 (×3): qty 0.4

## 2016-12-10 MED ORDER — COCONUT OIL OIL: 1.0000 "application " | TOPICAL_OIL | Status: DC | PRN

## 2016-12-10 MED ORDER — OXYCODONE HCL 5 MG PO TABS
10.0000 mg | ORAL_TABLET | ORAL | Status: DC | PRN
  Administered 2016-12-12 (×2): 10 mg via ORAL
  Filled 2016-12-10 (×2): qty 2

## 2016-12-10 MED ORDER — BUPIVACAINE IN DEXTROSE 0.75-8.25 % IT SOLN
INTRATHECAL | Status: AC
  Filled 2016-12-10: qty 2

## 2016-12-10 MED ORDER — NALOXONE HCL 2 MG/2ML IJ SOSY: 1.0000 ug/kg/h | PREFILLED_SYRINGE | INTRAVENOUS | Status: DC | PRN

## 2016-12-10 MED ORDER — OXYTOCIN 10 UNIT/ML IJ SOLN
INTRAVENOUS | Status: DC | PRN
  Administered 2016-12-10: 40 [IU] via INTRAVENOUS

## 2016-12-10 SURGICAL SUPPLY — 35 items
BENZOIN TINCTURE PRP APPL 2/3 (GAUZE/BANDAGES/DRESSINGS) ×3 IMPLANT
CHLORAPREP W/TINT 26ML (MISCELLANEOUS) ×3 IMPLANT
CLAMP CORD UMBIL (MISCELLANEOUS) IMPLANT
CLOSURE STERI STRIP 1/2 X4 (GAUZE/BANDAGES/DRESSINGS) ×2 IMPLANT
CLOSURE WOUND 1/2 X4 (GAUZE/BANDAGES/DRESSINGS) ×1
CLOTH BEACON ORANGE TIMEOUT ST (SAFETY) ×3 IMPLANT
DECANTER SPIKE VIAL GLASS SM (MISCELLANEOUS) ×3 IMPLANT
DRSG OPSITE POSTOP 4X10 (GAUZE/BANDAGES/DRESSINGS) ×3 IMPLANT
ELECT REM PT RETURN 9FT ADLT (ELECTROSURGICAL) ×3
ELECTRODE REM PT RTRN 9FT ADLT (ELECTROSURGICAL) ×1 IMPLANT
EXTRACTOR VACUUM M CUP 4 TUBE (SUCTIONS) IMPLANT
EXTRACTOR VACUUM M CUP 4' TUBE (SUCTIONS)
GAUZE SPONGE 4X4 12PLY STRL LF (GAUZE/BANDAGES/DRESSINGS) ×6 IMPLANT
GLOVE BIOGEL PI IND STRL 7.0 (GLOVE) ×3 IMPLANT
GLOVE BIOGEL PI INDICATOR 7.0 (GLOVE) ×6
GLOVE ECLIPSE 7.0 STRL STRAW (GLOVE) ×12 IMPLANT
GOWN STRL REUS W/TWL LRG LVL3 (GOWN DISPOSABLE) ×9 IMPLANT
KIT ABG SYR 3ML LUER SLIP (SYRINGE) IMPLANT
NEEDLE HYPO 22GX1.5 SAFETY (NEEDLE) ×3 IMPLANT
NEEDLE HYPO 25X5/8 SAFETYGLIDE (NEEDLE) IMPLANT
NS IRRIG 1000ML POUR BTL (IV SOLUTION) ×3 IMPLANT
PACK C SECTION WH (CUSTOM PROCEDURE TRAY) ×3 IMPLANT
PAD ABD 7.5X8 STRL (GAUZE/BANDAGES/DRESSINGS) ×3 IMPLANT
PAD OB MATERNITY 4.3X12.25 (PERSONAL CARE ITEMS) ×3 IMPLANT
PENCIL SMOKE EVAC W/HOLSTER (ELECTROSURGICAL) ×3 IMPLANT
RTRCTR C-SECT PINK 25CM LRG (MISCELLANEOUS) ×3 IMPLANT
SPONGE GAUZE 4X4 12PLY STER LF (GAUZE/BANDAGES/DRESSINGS) ×3 IMPLANT
STRIP CLOSURE SKIN 1/2X4 (GAUZE/BANDAGES/DRESSINGS) ×2 IMPLANT
SUT PLAIN 2 0 XLH (SUTURE) ×3 IMPLANT
SUT VIC AB 0 CTX 36 (SUTURE) ×6
SUT VIC AB 0 CTX36XBRD ANBCTRL (SUTURE) ×3 IMPLANT
SUT VIC AB 4-0 KS 27 (SUTURE) ×3 IMPLANT
SYR 30ML LL (SYRINGE) ×3 IMPLANT
TOWEL OR 17X24 6PK STRL BLUE (TOWEL DISPOSABLE) ×3 IMPLANT
TRAY FOLEY BAG SILVER LF 14FR (SET/KITS/TRAYS/PACK) ×3 IMPLANT

## 2016-12-10 NOTE — Anesthesia Preprocedure Evaluation (Signed)
Anesthesia Evaluation  Patient identified by MRN, date of birth, ID band Patient awake    Reviewed: Allergy & Precautions, NPO status , Patient's Chart, lab work & pertinent test results  Airway Mallampati: II  TM Distance: >3 FB Neck ROM: Full    Dental  (+) Teeth Intact, Dental Advisory Given   Pulmonary neg pulmonary ROS,    Pulmonary exam normal breath sounds clear to auscultation       Cardiovascular negative cardio ROS Normal cardiovascular exam Rhythm:Regular Rate:Normal     Neuro/Psych negative neurological ROS     GI/Hepatic negative GI ROS, Neg liver ROS,   Endo/Other  diabetes, Gestational, Oral Hypoglycemic AgentsMorbid obesity  Renal/GU negative Renal ROS     Musculoskeletal negative musculoskeletal ROS (+)   Abdominal   Peds  Hematology  (+) Blood dyscrasia, anemia , Plt 219k   Anesthesia Other Findings Day of surgery medications reviewed with the patient.  Reproductive/Obstetrics (+) Pregnancy H/o C-section x2                             Anesthesia Physical Anesthesia Plan  ASA: III  Anesthesia Plan: Spinal   Post-op Pain Management:    Induction:   PONV Risk Score and Plan: 2 and Ondansetron, Dexamethasone, Treatment may vary due to age or medical condition and Scopolamine patch - Pre-op  Airway Management Planned:   Additional Equipment:   Intra-op Plan:   Post-operative Plan:   Informed Consent: I have reviewed the patients History and Physical, chart, labs and discussed the procedure including the risks, benefits and alternatives for the proposed anesthesia with the patient or authorized representative who has indicated his/her understanding and acceptance.   Dental advisory given  Plan Discussed with: CRNA, Anesthesiologist and Surgeon  Anesthesia Plan Comments: (Discussed risks and benefits of and differences between spinal and general. Discussed  risks of spinal including headache, backache, failure, bleeding, infection, and nerve damage. Patient consents to spinal. Questions answered. Coagulation studies and platelet count acceptable.)        Anesthesia Quick Evaluation

## 2016-12-10 NOTE — Interval H&P Note (Deleted)
History and Physical Interval Note:  12/10/2016 11:18 AM  Vicki Griffin  has presented today for surgery, with the diagnosis of RCS Undesired Fertility  The various methods of treatment have been discussed with the patient and family. After consideration of risks, benefits and other options for treatment, the patient has consented to  Procedure(s): CESAREAN SECTION WITH BILATERAL TUBAL LIGATION (Bilateral) as a surgical intervention .  The patient's history has been reviewed, patient examined, no change in status, stable for surgery.  I have reviewed the patient's chart and labs.  Questions were answered to the patient's satisfaction.     Reva Bores

## 2016-12-10 NOTE — Anesthesia Postprocedure Evaluation (Signed)
Anesthesia Post Note  Patient: Vicki Griffin  Procedure(s) Performed: Procedure(s) (LRB): CESAREAN SECTION WITH BILATERAL TUBAL LIGATION (Bilateral)     Patient location during evaluation: PACU Anesthesia Type: Spinal Level of consciousness: oriented and awake and alert Pain management: pain level controlled Vital Signs Assessment: post-procedure vital signs reviewed and stable Respiratory status: spontaneous breathing, respiratory function stable and patient connected to nasal cannula oxygen Cardiovascular status: blood pressure returned to baseline and stable Postop Assessment: no headache, no backache, no apparent nausea or vomiting, spinal receding and patient able to bend at knees Anesthetic complications: no    Last Vitals:  Vitals:   12/10/16 1500 12/10/16 1518  BP: 114/66 (!) 116/58  Pulse: 77 67  Resp: 20 18  Temp:  36.6 C  SpO2: 98% 98%    Last Pain:  Vitals:   12/10/16 1520  TempSrc:   PainSc: 2    Pain Goal: Patients Stated Pain Goal: 3 (12/10/16 1520)               Cecile Hearing

## 2016-12-10 NOTE — Telephone Encounter (Signed)
Left message on cell phone voicemail to call [id number] to schedule incision check and 2 GTT. She has postpartum appointment scheduled with Dr Adrian Blackwater 10/15 @ 3:30.

## 2016-12-10 NOTE — Lactation Note (Signed)
This note was copied from a baby's chart. Lactation Consultation Note  Patient Name: Vicki Griffin [id number] Date: 12/10/2016    Mother's feeding intention is breast/formula. RN will change formula from Alimentum to regular Similac.  Lurline Hare [facility] 12/10/2016, 10:09 PM

## 2016-12-10 NOTE — Anesthesia Procedure Notes (Signed)
Spinal  Patient location during procedure: OR Start time: 12/10/2016 11:59 AM End time: 12/10/2016 12:01 PM Staffing Anesthesiologist: Cecile Hearing Performed: anesthesiologist  Preanesthetic Checklist Completed: patient identified, surgical consent, pre-op evaluation, timeout performed, IV checked, risks and benefits discussed and monitors and equipment checked Spinal Block Patient position: sitting Prep: site prepped and draped and DuraPrep Patient monitoring: continuous pulse ox and blood pressure Approach: midline Location: L3-4 Injection technique: single-shot Needle Needle type: Pencan  Needle gauge: 24 G Assessment Sensory level: T4 Additional Notes Functioning IV was confirmed and monitors were applied. Sterile prep and drape, including hand hygiene, mask and sterile gloves were used. The patient was positioned and the spine was prepped. The skin was anesthetized with lidocaine.  Free flow of clear CSF was obtained prior to injecting local anesthetic into the CSF.  The spinal needle aspirated freely following injection.  The needle was carefully withdrawn.  The patient tolerated the procedure well. Consent was obtained prior to procedure with all questions answered and concerns addressed. Risks including but not limited to bleeding, infection, nerve damage, paralysis, failed block, inadequate analgesia, allergic reaction, high spinal, itching and headache were discussed and the patient wished to proceed.   Arrie Aran, MD

## 2016-12-10 NOTE — Op Note (Signed)
Preoperative Diagnosis:  IUP @ [redacted]w[redacted]d, Previous C-section x 2   Postoperative Diagnosis:  Same  Procedure: Repeat low transverse cesarean section  Surgeon: Tinnie Gens, M.D.  Assistant: Therese Sarah, MD  Findings: Viable female infant, APGAR (1 MIN): 8   APGAR (5 MINS): 9, vertex presentation  Estimated blood loss: 1000 cc  Complications: None known  Specimens: Placenta to labor and delivery  Reason for procedure: Briefly, the patient is a 36 y.o. [id number] [redacted]w[redacted]d who presents for elective repeat C-section  Procedure: Patient is a to the OR where spinal analgesia was administered. She was then placed in a supine position with left lateral tilt. She received 2 g of Ancef and SCDs were in place. A timeout was performed. She was prepped and draped in the usual sterile fashion. A Foley catheter was placed in the bladder. A knife was then used to make a Pfannenstiel incision. A keloid scar was removed. This incision was carried out to underlying fascia which was divided in the midline with the knife. The incision was extended laterally, sharply.  The fascia was dissected off the underlying rectus superiorly and inferiorly. The rectus was divided in the midline.  The peritoneal cavity was not easily opened. The uterus was adherent to the anterior abdominal wall. A small defect was made on the superior exterior portion of the uterus in trying to enter the peritoneal cavity. The upper portion of the uterus was not available on the left side.  The bladder was taken down sharply.  A knife was used to make a low transverse incision on the uterus. This incision was carried down to the amniotic cavity was entered. Fetus was in LOA position and was brought up out of the incision without difficulty. Delayed cord clamping x 1 minute. Cord was clamped x 2 and cut. Infant taken to waiting nurse.  Cord blood was obtained. Placenta was delivered from the uterus.  Uterus was cleaned with dry lap pads. Uterine incision  closed with 0 Vicryl suture in a locked running fashion. The defect superiorly was closed with running vicryl.  Peritoneal closure was done with 0 Vicryl suture.  Fascia is closed with 0 Vicryl suture in a running fashion. Subcutaneous tissue infused with 30cc 0.25% Marcaine.  Subcutaneous closure was performed with 0 plain suture.  Skin closed using 3-0 Vicryl on a Keith needle.  Steri strips applied, followed by pressure dressing.  All instrument, needle and lap counts were correct x 2.  Patient was awake and taken to PACU stable.  Infant remained with mom in couplet care, stable.   Shelbie Proctor PrattMD 12/10/2016 1:01 PM

## 2016-12-10 NOTE — Transfer of Care (Signed)
Immediate Anesthesia Transfer of Care Note  Patient: Vicki Griffin  Procedure(s) Performed: Procedure(s): CESAREAN SECTION WITH BILATERAL TUBAL LIGATION (Bilateral)  Patient Location: PACU  Anesthesia Type:Spinal  Level of Consciousness: awake, alert  and oriented  Airway & Oxygen Therapy: Patient Spontanous Breathing  Post-op Assessment: Report given to RN and Post -op Vital signs reviewed and stable  Post vital signs: Reviewed and stable HR 70, RR 16, SaO2 98%, BP 104/56  Last Vitals:  Vitals:   12/10/16 1006  BP: 118/61  Pulse: 85  Resp: 20  Temp: 36.9 C    Last Pain:  Vitals:   12/10/16 1006  TempSrc: Oral         Complications: No apparent anesthesia complications

## 2016-12-10 NOTE — H&P (Signed)
Vicki Griffin is an 36 y.o. [id number] [redacted]w[redacted]d female.   Chief Complaint: Previous C-section, undesired fertility HPI: 39 wks, here for RCS. Prev. Csection x 2. Desires sterility  Past Medical History:  Diagnosis Date  . Complication of anesthesia    Back pain related to spinal anesthesia  . Gestational diabetes   . Medical history non-contributory     Past Surgical History:  Procedure Laterality Date  . APPENDECTOMY    . CESAREAN SECTION      Family History  Problem Relation Age of Onset  . Arthritis Mother   . Stroke Father   . Heart disease Father   . Hypertension Father    Social History:  reports that she has never smoked. She has never used smokeless tobacco. She reports that she does not drink alcohol or use drugs.   No Known Allergies  No current facility-administered medications on file prior to encounter.    Current Outpatient Prescriptions on File Prior to Encounter  Medication Sig Dispense Refill  . ondansetron (ZOFRAN ODT) 4 MG disintegrating tablet Allow 1-2 tablets to dissolve in your mouth every 8 hours as needed for nausea/vomiting (Patient not taking: Reported on 12/06/2016) 30 tablet 0    A comprehensive review of systems was negative.  Last menstrual period 03/12/2016. General appearance: alert, cooperative and appears stated age Head: Normocephalic, without obvious abnormality, atraumatic Neck: supple, symmetrical, trachea midline Lungs: normal effort Heart: regular rate and rhythm Abdomen: Gravid, NT Extremities: Homans sign is negative, no sign of DVT Skin: Skin color, texture, turgor normal. No rashes or lesions Neurologic: Grossly normal   Lab Results  Component Value Date   WBC 7.3 12/07/2016   HGB 11.7 (L) 12/07/2016   HCT 34.7 (L) 12/07/2016   MCV 88.5 12/07/2016   PLT 219 12/07/2016         ABO, Rh: --/--/O POS, O POS (09/21 1429)  Antibody: NEG (09/21 1429)  Rubella: !Error!  RPR: Non Reactive (09/21 1429)  HBsAg: Negative  (05/16 1635)  HIV:    GBS: Positive (08/30 1157)     Assessment/Plan Principal Problem:   History of C-section x 2 Active Problems:   Gestational diabetes mellitus, antepartum   Encounter for sterilization  For ERLTCS and BTL. Risks include but are not limited to bleeding, infection, injury to surrounding structures, including bowel, bladder and ureters, blood clots, and death.  Likelihood of success is high. Patient counseled, r.e. Risks benefits of BTL, including permanency of procedure, risk of failure(1:100), increased risk of ectopic.  Patient verbalized understanding and desires to proceed   Reva Bores 12/10/2016, 9:02 AM

## 2016-12-10 NOTE — Interval H&P Note (Signed)
History and Physical Interval Note:  12/10/2016 11:19 AM  Vicki Griffin  has presented today for surgery, with the diagnosis of RCS Undesired Fertility  The various methods of treatment have been discussed with the patient and family. After consideration of risks, benefits and other options for treatment, the patient has consented to  Procedure(s): CESAREAN SECTION WITH BILATERAL TUBAL LIGATION (Bilateral) as a surgical intervention .  The patient's history has been reviewed, patient examined, no change in status, stable for surgery.  I have reviewed the patient's chart and labs.  Questions were answered to the patient's satisfaction.  Patient has changed her mind and no longer desires sterilization.   Reva Bores

## 2016-12-11 LAB — CBC
HCT: 31.5 % — ABNORMAL LOW (ref 36.0–46.0)
Hemoglobin: 10.6 g/dL — ABNORMAL LOW (ref 12.0–15.0)
MCH: 29.6 pg (ref 26.0–34.0)
MCHC: 33.7 g/dL (ref 30.0–36.0)
MCV: 88 fL (ref 78.0–100.0)
PLATELETS: 206 10*3/uL (ref 150–400)
RBC: 3.58 MIL/uL — ABNORMAL LOW (ref 3.87–5.11)
RDW: 14.2 % (ref 11.5–15.5)
WBC: 13.3 10*3/uL — ABNORMAL HIGH (ref 4.0–10.5)

## 2016-12-11 LAB — GLUCOSE, CAPILLARY: Glucose-Capillary: 77 mg/dL (ref 65–99)

## 2016-12-11 NOTE — Addendum Note (Signed)
Addendum  created 12/11/16 1649 by Bethena Midget, MD   Sign clinical note

## 2016-12-11 NOTE — Addendum Note (Signed)
Addendum  created 12/11/16 0657 by Yolonda Kida, CRNA   Sign clinical note

## 2016-12-11 NOTE — Progress Notes (Signed)
Post Operative Day 1  Subjective:  Vicki Griffin is a 36 y.o. G3P3003 [redacted]w[redacted]d s/p rLTCS.  No acute events overnight.  Pt denies problems with ambulating, voiding or po intake.  She denies nausea or vomiting.  Pain is moderately controlled.  She has not had flatus. She has not had bowel movement.  Lochia Moderate.  Plan for birth control is unsure.  Method of Feeding: breast and bottle  Objective: BP (!) 103/57 (BP Location: Left Arm)   Pulse 70   Temp 97.6 F (36.4 C) (Oral)   Resp 18   Ht  (1.676 m)   Wt 121.1 kg (267 lb)   LMP 03/12/2016   SpO2 98%   Breastfeeding? Unknown   BMI 43.09 kg/m   Physical Exam:  General: alert, cooperative and no distress Lochia:normal flow Chest: no increased work of breathing Abdomen: soft, nontender, fundus firm at/below umbilicus Uterine Fundus: firm DVT Evaluation: No evidence of DVT seen on physical exam. Extremities: No edema   Recent Labs  12/11/16 0545  HGB 10.6*  HCT 31.5*    Assessment/Plan:  ASSESSMENT: Vicki Griffin is a 36 y.o. G3P3003 [redacted]w[redacted]d pod #1 s/p rLTCS doing well.   Plan for discharge tomorrow   LOS: 1 day   Amanda C. Frances Furbish, MD PGY-1, [facility] 12/11/2016 10:03 AM  CNM attestation Post Partum Day #1 I have seen and examined this patient and agree with above documentation in the resident's note.   Vicki Griffin is a 36 y.o. [id number] s/p rLTCS.  Pt denies problems with ambulating, voiding or po intake. Pain is well controlled.  Plan for birth control is undecided.  Method of Feeding: both  Having slight dizziness with ambulation.  PE:  BP (!) 103/57 (BP Location: Left Arm)   Pulse 70   Temp 97.6 F (36.4 C) (Oral)   Resp 18   Ht  (1.676 m)   Wt 121.1 kg (267 lb)   LMP 03/12/2016   SpO2 98%   Breastfeeding? Unknown   BMI 43.09 kg/m  Fundus firm  Plan for discharge: 12/12/16 Continue to watch w/ ambulation- VSS w/ only 1gm Hgb drop from surgery so expect it to  improve.  Cam Hai, CNM 10:23 AM 12/11/2016

## 2016-12-11 NOTE — Anesthesia Postprocedure Evaluation (Signed)
Anesthesia Post Note  Patient: Vicki Griffin  Procedure(s) Performed: Procedure(s) (LRB): CESAREAN SECTION WITH BILATERAL TUBAL LIGATION (Bilateral)     Patient location during evaluation: Mother Baby Anesthesia Type: Spinal Level of consciousness: awake, awake and alert, oriented and patient cooperative Pain management: pain level controlled Vital Signs Assessment: post-procedure vital signs reviewed and stable Respiratory status: spontaneous breathing, nonlabored ventilation and respiratory function stable Cardiovascular status: stable Postop Assessment: no headache, no backache, patient able to bend at knees and no apparent nausea or vomiting Anesthetic complications: no    Last Vitals:  Vitals:   12/11/16 0232 12/11/16 0555  BP: (!) 112/55 (!) 103/57  Pulse: 81 70  Resp: 18 18  Temp: 36.6 C 36.4 C  SpO2: 97% 98%    Last Pain:  Vitals:   12/11/16 0555  TempSrc: Oral  PainSc: 0-No pain   Pain Goal: Patients Stated Pain Goal: 3 (12/10/16 1830)               Kilan Banfill L

## 2016-12-11 NOTE — Progress Notes (Signed)
Called to see patient for balance issues.  Currently breast feeding.  Nursing informs me that she has had problems ambulating and unable to walk without leaning across the room.  I believe that this may be secondary to the scopolamine patch that we have removed and I will pass this forward to the attending.  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   dry mouth, blurred vision, sensitivity to light, lack of sweating, dizziness, nausea, loss of balance, hypersensitivity reactions (such as skin rash), and rapid heartbeat (tachycardia).  ej Vicki Griffin jr md [phone]

## 2016-12-11 NOTE — Progress Notes (Signed)
Contacted T/S spoke to Dr. Frances Furbish in regards to pt still having foley catheter at 22 hours post-op. Report given to writer this morning night nurse stated that pt got very light headed when attempted to stand and urine was concentrated last night with low output. This morning urine output is adequate and urine is clear/yellow. Pt is still getting light headed when standing but was able to ambulate to bathroom with assist X 2. Dr. Frances Furbish stated to go ahead and D/C foley catheter and advise pt not to attempt to ambulate without assistance.

## 2016-12-12 ENCOUNTER — Telehealth: Payer: Self-pay | Admitting: Radiology

## 2016-12-12 MED ORDER — SENNOSIDES-DOCUSATE SODIUM 8.6-50 MG PO TABS: 2.0000 | ORAL_TABLET | Freq: Every evening | ORAL | 1 refills | Status: AC | PRN

## 2016-12-12 MED ORDER — IBUPROFEN 600 MG PO TABS: 600.0000 mg | ORAL_TABLET | Freq: Four times a day (QID) | ORAL | 0 refills | Status: AC

## 2016-12-12 MED ORDER — OXYCODONE HCL 5 MG PO TABS: 5.0000 mg | ORAL_TABLET | ORAL | 0 refills | Status: AC | PRN

## 2016-12-12 NOTE — Telephone Encounter (Signed)
Left voicemail on cell phone to call [facility] need to reschedule her postpartum appointment (12/31/16 @ 3:30)  for in the morning so that she will be fasting for her 2 GTT.

## 2016-12-12 NOTE — Lactation Note (Signed)
This note was copied from a baby's chart. Lactation Consultation Note  Patient Name: Girl Jeanell Mangan ZOXWR'U Date: 12/12/2016  Pecola Leisure is in the crib sleeping ,  Moms feeding preference - breast - formula , last feeding was a bottle .  Lc enc mom to call with feeding cues  Mother informed of post-discharge support and given phone number to the lactation department, including services for phone call assistance; out-patient appointments; and breastfeeding support group. List of other breastfeeding resources in the community given in the handout. Encouraged mother to call for problems or concerns related to breastfeeding.     Maternal Data    Feeding Feeding Type: Formula  LATCH Score                   Interventions    Lactation Tools Discussed/Used     Consult Status      Matilde Sprang Jessen Siegman 12/12/2016, 7:54 AM

## 2016-12-12 NOTE — Discharge Summary (Signed)
OB Discharge Summary     Patient Name: Vicki Griffin DOB: 05/24/1980 MRN: [id number]  Date of admission: 12/10/2016 Delivering MD: Donnamae Jude   Date of discharge: 12/12/2016  Admitting diagnosis: RCS Undesired Fertility Intrauterine pregnancy: [redacted]w[redacted]d    Secondary diagnosis:  Principal Problem:   History of C-section x 2 Active Problems:   Gestational diabetes mellitus, antepartum   Encounter for sterilization   Postpartum care following cesarean delivery  Additional problems:  Patient Active Problem List   Diagnosis Date Noted  . Encounter for sterilization 12/10/2016  . Postpartum care following cesarean delivery 12/10/2016  . GBS (group B Streptococcus carrier), +RV culture, currently pregnant 11/20/2016  . Gestational diabetes mellitus, antepartum 10/01/2016  . Supervision of high-risk pregnancy 08/01/2016  . AMA (advanced maternal age) multigravida 363+ second trimester 08/01/2016  . Late prenatal care affecting pregnancy, antepartum 08/01/2016  . History of C-section x 2 08/01/2016  . Obesity in pregnancy, antepartum 08/01/2016      Discharge diagnosis: Term Pregnancy Delivered                                                                                                Post partum procedures:none  Augmentation: none  Complications: None  Hospital course:  Sceduled C/S   36y.o. yo G3P3003 at 385w0das admitted to the hospital 12/10/2016 for scheduled cesarean section with the following indication:Elective Repeat.  Membrane Rupture Time/Date: 12:29 PM ,12/10/2016   Patient delivered a Viable infant.12/10/2016  Details of operation can be found in separate operative note.  Pateint had an uncomplicated postpartum course.  She is ambulating, tolerating a regular diet, passing flatus, and urinating well. Patient is discharged home in stable condition on  12/14/16         Physical exam  Vitals:   12/11/16 0555 12/11/16 0900 12/11/16 1752 12/12/16 0610  BP:  (!) 103/57 (!) 106/52 122/75 119/62  Pulse: 70 78 88 73  Resp: 18 (!) 23 18 18   Temp: 97.6 F (36.4 C) 98.2 F (36.8 C) 99.3 F (37.4 C) 98.7 F (37.1 C)  TempSrc: Oral Oral Oral Oral  SpO2: 98%     Weight:      Height:       General: alert, cooperative and mild distress Lochia: appropriate Uterine Fundus: firm Abdomen: soft, mildly distended, bowel sounds present x 4 quadrants, diffuse tenderness worse in right suprapubic region.  Incision: healing well, no significant drainage, no dehiscence, no significant erythema DVT Evaluation: No evidence of DVT seen on physical exam. Negative Homan's sign. No cords or calf tenderness.  Labs: Lab Results  Component Value Date   WBC 13.3 (H) 12/11/2016   HGB 10.6 (L) 12/11/2016   HCT 31.5 (L) 12/11/2016   MCV 88.0 12/11/2016   PLT 206 12/11/2016   CMP Latest Ref Rng & Units 12/10/2016  Glucose 65 - 99 mg/dL -  BUN 6 - 20 mg/dL -  Creatinine 0.44 - 1.00 mg/dL 0.57  Sodium 135 - 145 mmol/L -  Potassium 3.5 - 5.1 mmol/L -  Chloride 101 - 111 mmol/L -  CO2 22 -  32 mmol/L -  Calcium 8.9 - 10.3 mg/dL -  Total Protein 6.5 - 8.1 g/dL -  Total Bilirubin 0.3 - 1.2 mg/dL -  Alkaline Phos 38 - 126 U/L -  AST 15 - 41 U/L -  ALT 14 - 54 U/L -    Discharge instruction: per After Visit Summary and "Baby and Me Booklet".  After visit meds:  Allergies as of 12/12/2016   No Known Allergies     Medication List    STOP taking these medications   glyBURIDE 1.25 MG tablet Commonly known as:  DIABETA     TAKE these medications   ACCU-CHEK AVIVA PLUS w/Device Kit See admin instructions.   ACCU-CHEK AVIVA PLUS w/Device Kit USE AS INSTRUCTED   accu-chek soft touch lancets Use as instructed to check blood sugars four times daily   CONCEPT DHA 53.5-38-1 MG Caps Take 1 capsule by mouth daily.   glucose blood test strip Commonly known as:  ACCU-CHEK AVIVA Use as instructed to check blood sugars four times daily   ibuprofen 600 MG  tablet Commonly known as:  ADVIL,MOTRIN Take 1 tablet (600 mg total) by mouth every 6 (six) hours.   oxyCODONE 5 MG immediate release tablet Commonly known as:  Oxy IR/ROXICODONE Take 1 tablet (5 mg total) by mouth every 4 (four) hours as needed (pain scale 4-7).   senna-docusate 8.6-50 MG tablet Commonly known as:  Senokot-S Take 2 tablets by mouth at bedtime as needed for mild constipation.            Discharge Care Instructions        Start     Ordered   12/12/16 0000  senna-docusate (SENOKOT-S) 8.6-50 MG tablet  At bedtime PRN     12/12/16 0958   12/12/16 0000  oxyCODONE (OXY IR/ROXICODONE) 5 MG immediate release tablet  Every 4 hours PRN     12/12/16 0958   12/12/16 0000  ibuprofen (ADVIL,MOTRIN) 600 MG tablet  Every 6 hours     12/12/16 0958      Diet: routine diet  Activity: Advance as tolerated. Pelvic rest for 6 weeks.   Follow up Appt:Future Appointments Date Time Provider [place]  12/24/2016 3:00 PM [facility] NURSE [facility]  12/31/2016 3:30 PM Truett Mainland, DO [facility]    Postpartum contraception: Undecided; initially desired BTL but changed mind during c/s  Newborn Data: Live born female  Birth Weight: 8 lb 5.9 oz (3795 g) APGAR: 8, 9  Baby Feeding: Bottle and Breast Disposition:home with mother   12/12/2016 Luiz Blare, DO

## 2016-12-12 NOTE — Discharge Instructions (Signed)

## 2016-12-12 NOTE — Progress Notes (Signed)
Post Partum Day 2 Subjective: Patient reports moderate diffuse abdominal pain. She received oxycodone 10 mg PO this morning which has not seemed to help. She denies BM or flatulence and feels bloated. She is getting senokot and mylicon. Patient plans on breast and bottle feeding. She is not interested in birth control at this time because her mother suffered from complications. Patient reports minimal vaginal bleeding. Up ad lib, voiding ok. No other complaints at this time.   Objective: Blood pressure 119/62, pulse 73, temperature 98.7 F (37.1 C), temperature source Oral, resp. rate 18, height  (1.676 m), weight 121.1 kg (267 lb), last menstrual period 03/12/2016, SpO2 98 %, unknown if currently breastfeeding.  Physical Exam:  General: alert, cooperative and mild distress Lochia: appropriate Uterine Fundus: firm Abdomen: soft, mildly distended, bowel sounds present x 4 quadrants, diffuse tenderness worse in right suprapubic region.  Incision: healing well, no significant drainage, no dehiscence, no significant erythema DVT Evaluation: No evidence of DVT seen on physical exam. Negative Homan's sign. No cords or calf tenderness.   Recent Labs  12/11/16 0545  HGB 10.6*  HCT 31.5*    Assessment/Plan: Plan for discharge tomorrow Reassess later today    LOS: 2 days   Dyke Maes Derl Abalos, PA-S 12/12/2016, 7:56 AM

## 2016-12-13 ENCOUNTER — Ambulatory Visit: Payer: Self-pay

## 2016-12-13 NOTE — Lactation Note (Signed)
This note was copied from a baby's chart. Lactation Consultation Note Mom engorged. Mom used DEBP, only had dusting of flange. Mom's breast are hard! Tender that she can't hardly stand anyone to touch them. Can't tolerate breast massage. Encouraged mom to massage breast. demonstrated how mom should do that. Woke baby to stimulate to BF. Baby sleepy, needing slots of stimulation. Latched onto Lt. Breast in football position, latched baby. Mom has big everted nipples, baby was able to latch well. Baby needed stimulated frequently to cont. BF. LC massaged lightly occasionally breast to soften near areola to aide in expressing to keep baby stimulated, also release milk.  Cabbage applied all around breast, leaving areola and nipple uncovered. ICE at intervals, 20 min,. On 20 min off, rotate around breast. Rt. Breast mom can't tolerate to be touched. Explained to mom it has to be massaged to released, more milk will cont. To flow into breast. Needs to pump and someone massage breast as pumping. Mom has no support person at bedside.  Laid mom in semi fowlers position. Baby started feeding much better.  Baby on DPT. Lights laid over top of baby while BF since BF so long.  Patient Name: [name] UEAVW'U Date: 12/13/2016 Reason for consult: Follow-up assessment;Engorgement   Maternal Data Has patient been taught Hand Expression?: Yes  Feeding Feeding Type: Breast Fed Length of feed: 40 min (still BF)  LATCH Score Latch: Grasps breast easily, tongue down, lips flanged, rhythmical sucking.  Audible Swallowing: Spontaneous and intermittent  Type of Nipple: Everted at rest and after stimulation  Comfort (Breast/Nipple): Engorged, cracked, bleeding, large blisters, severe discomfort  Hold (Positioning): Assistance needed to correctly position infant at breast and maintain latch.  LATCH Score: 7  Interventions Interventions: Breast feeding basics reviewed;Breast compression;Assisted  with latch;Adjust position;Skin to skin;Support pillows;DEBP;Breast massage;Position options;Ice;Reverse pressure  Lactation Tools Discussed/Used Tools: Pump Breast pump type: Double-Electric Breast Pump Pump Review: Setup, frequency, and cleaning;Milk Storage Initiated by:: Landry Dyke RN Date initiated:: 12/12/16   Consult Status Consult Status: Follow-up Date: 12/13/16 Follow-up type: In-patient    Jettie Mannor, Diamond Nickel 12/13/2016, 1:38 AM

## 2016-12-13 NOTE — Lactation Note (Signed)
This note was copied from a baby's chart. Lactation Consultation Note  Patient Name: Vicki Griffin ZOXWR'U Date: 12/13/2016 Reason for consult: Follow-up assessment  Baby 28 hours old. Mom's breasts are engorged, but mom states that they feel much better this morning. Mom states that she gave some formula last night. Discussed with mom that her breasts need to be softened--breast milk removed, and baby needs to be fed, so enc mom to let this [facility] assist her with latching to achieve both goals simultaneously. Also discussed supply and demand and milk coming to volume. Mom attempted to latch baby herself in football position, and baby did latch to the tip of mom's nipple, but would not continue suckling. Offered to assist with latch, but mom declined stating that she thinks baby is tired of suckling. Baby fully dressed and has a heavy knit hat on her head. Discussed infant behavior and enc undressing baby and nursing STS to achieve a deeper latch and stimulate baby to stay awake at breast. Also enc mom to ice breast and then hand express to soften breast so that baby can latch and soften breasts for mom. Mom declined and stated that she would just like some more ice--which was given to mom. Demonstrated to mom how to use manual pump to remove some EBM so that baby can latch. Mom states that she still intends to use formula and breastfeed, and declines any further assistance at this time. Enc mom to call for assistance as needed. Mom wanting to know when she will be discharged, so told mom that this LC would notify her bedside nurse. Discussed assessment and interventions with Shanda Bumps, RN.    Maternal Data    Feeding Feeding Type: Breast Fed Length of feed: 3 min  LATCH Score Latch: Repeated attempts needed to sustain latch, nipple held in mouth throughout feeding, stimulation needed to elicit sucking reflex.  Audible Swallowing: None  Type of Nipple: Everted at rest and after  stimulation  Comfort (Breast/Nipple): Engorged, cracked, bleeding, large blisters, severe discomfort  Hold (Positioning): No assistance needed to correctly position infant at breast.  LATCH Score: 5  Interventions Interventions: Breast feeding basics reviewed;Ice  Lactation Tools Discussed/Used     Consult Status Consult Status: PRN    Sherlyn Hay 12/13/2016, 9:45 AM

## 2016-12-18 ENCOUNTER — Ambulatory Visit (INDEPENDENT_AMBULATORY_CARE_PROVIDER_SITE_OTHER): Payer: Medicaid Other | Admitting: Family Medicine

## 2016-12-18 ENCOUNTER — Encounter: Payer: Self-pay | Admitting: Family Medicine

## 2016-12-18 VITALS — BP 117/72 | HR 72 | Wt 261.2 lb

## 2016-12-18 DIAGNOSIS — R600 Localized edema: Secondary | ICD-10-CM

## 2016-12-18 DIAGNOSIS — L7682 Other postprocedural complications of skin and subcutaneous tissue: Secondary | ICD-10-CM

## 2016-12-18 MED ORDER — FUROSEMIDE 40 MG PO TABS: 40.0000 mg | ORAL_TABLET | Freq: Every day | ORAL | 0 refills | Status: AC

## 2016-12-18 NOTE — Progress Notes (Signed)
   Subjective:    Patient ID: Vicki Griffin is a 36 y.o. female presenting with Wound Check  on 12/18/2016  HPI: C-section on 9/24. Here with wound issue. Reports pain on right side. Also, notes using Ibuprofen. Pain is better today. Minimal bleeding. Has significant LE swelling, worse since going home. Bilateral. Raising legs without improvement.  Review of Systems  Constitutional: Negative for chills and fever.  Respiratory: Negative for shortness of breath.   Cardiovascular: Negative for chest pain.  Gastrointestinal: Negative for abdominal pain, nausea and vomiting.  Genitourinary: Negative for dysuria.  Skin: Negative for rash.      Objective:    BP 117/72   Pulse 72   Wt 261 lb 3.2 oz (118.5 kg)   LMP 03/12/2016   BMI 42.16 kg/m  Physical Exam  Constitutional: She is oriented to person, place, and time. She appears well-developed and well-nourished. No distress.  HENT:  Head: Normocephalic and atraumatic.  Eyes: No scleral icterus.  Neck: Neck supple.  Cardiovascular: Normal rate.   Pulmonary/Chest: Effort normal.  Abdominal: Soft.  Musculoskeletal: She exhibits edema (3-4+ bilaterally).  Neurological: She is alert and oriented to person, place, and time.  Skin: Skin is warm and dry.  Incision is well-healed. It has no surrounding erythema. Minimal opening of the incision, treated with AgNO3.  Psychiatric: She has a normal mood and affect.        Assessment & Plan:  Lower extremity edema - trial of Lasix x a few days--breast feeding impact discussed - Plan: furosemide (LASIX) 40 MG tablet  Pain at surgical incision - No signs of infection, keep area clean and dry   Total face-to-face time with patient: 15 minutes. Over 50% of encounter was spent on counseling and coordination of care. Return in about 5 weeks (around 01/22/2017) for pp check with 2 hour OGT.  Reva Bores 12/18/2016 4:12 PM

## 2016-12-19 NOTE — Patient Instructions (Signed)
Edema Edema is when you have too much fluid in your body or under your skin. Edema may make your legs, feet, and ankles swell up. Swelling is also common in looser tissues, like around your eyes. This is a common condition. It gets more common as you get older. There are many possible causes of edema. Eating too much salt (sodium) and being on your feet or sitting for a long time can cause edema in your legs, feet, and ankles. Hot weather may make edema worse. Edema is usually painless. Your skin may look swollen or shiny. Follow these instructions at home:  Keep the swollen body part raised (elevated) above the level of your heart when you are sitting or lying down.  Do not sit still or stand for a long time.  Do not wear tight clothes. Do not wear garters on your upper legs.  Exercise your legs. This can help the swelling go down.  Wear elastic bandages or support stockings as told by your doctor.  Eat a low-salt (low-sodium) diet to reduce fluid as told by your doctor.  Depending on the cause of your swelling, you may need to limit how much fluid you drink (fluid restriction).  Take over-the-counter and prescription medicines only as told by your doctor. Contact a doctor if:  Treatment is not working.  You have heart, liver, or kidney disease and have symptoms of edema.  You have sudden and unexplained weight gain. Get help right away if:  You have shortness of breath or chest pain.  You cannot breathe when you lie down.  You have pain, redness, or warmth in the swollen areas.  You have heart, liver, or kidney disease and get edema all of a sudden.  You have a fever and your symptoms get worse all of a sudden. Summary  Edema is when you have too much fluid in your body or under your skin.  Edema may make your legs, feet, and ankles swell up. Swelling is also common in looser tissues, like around your eyes.  Raise (elevate) the swollen body part above the level of your  heart when you are sitting or lying down.  Follow your doctor's instructions about diet and how much fluid you can drink (fluid restriction). This information is not intended to replace advice given to you by your health care provider. Make sure you discuss any questions you have with your health care provider. Document Released: 08/22/2007 Document Revised: 03/23/2016 Document Reviewed: 03/23/2016 Elsevier Interactive Patient Education  2017 [employer]  

## 2016-12-24 ENCOUNTER — Encounter: Payer: Self-pay | Admitting: *Deleted

## 2016-12-24 ENCOUNTER — Other Ambulatory Visit: Payer: Medicaid Other | Admitting: *Deleted

## 2016-12-24 ENCOUNTER — Ambulatory Visit (INDEPENDENT_AMBULATORY_CARE_PROVIDER_SITE_OTHER): Payer: Medicaid Other | Admitting: *Deleted

## 2016-12-24 DIAGNOSIS — Z4889 Encounter for other specified surgical aftercare: Secondary | ICD-10-CM

## 2016-12-24 NOTE — Progress Notes (Signed)
S: Pt here for cesarean section incision check. No complaints  O: The wound is cleansed, debrided of foreign material as much as possible, and dressed. The patient is alerted to watch for any signs of infection (redness, pus, pain, increased swelling or fever)   A: Wound healing well on examination  P: RTO if signs of infection. Home wound care instructions are provided.     

## 2016-12-24 NOTE — Progress Notes (Signed)
S: Pt here for cesarean section incision check. No complaints  O: The wound is cleansed, debrided of foreign material as much as possible, and dressed. The patient is alerted to watch for any signs of infection (redness, pus, pain, increased swelling or fever)   A: Wound healing well on examination  P: RTO if signs of infection. Home wound care instructions are provided.

## 2016-12-24 NOTE — Progress Notes (Signed)
Agree with nursing staff's documentation of this patient's clinic encounter.  Ugonna Anyanwu, MD    

## 2016-12-25 NOTE — Progress Notes (Signed)
Agree with nursing staff's documentation of this patient's clinic encounter.  Noelle Sease, MD    

## 2016-12-31 ENCOUNTER — Ambulatory Visit: Payer: Medicaid Other | Admitting: Family Medicine

## 2017-01-08 ENCOUNTER — Ambulatory Visit (INDEPENDENT_AMBULATORY_CARE_PROVIDER_SITE_OTHER): Payer: Medicaid Other | Admitting: Obstetrics & Gynecology

## 2017-01-08 DIAGNOSIS — Z1389 Encounter for screening for other disorder: Secondary | ICD-10-CM

## 2017-01-08 DIAGNOSIS — Z8632 Personal history of gestational diabetes: Secondary | ICD-10-CM

## 2017-01-08 NOTE — Patient Instructions (Signed)
Return to clinic for any scheduled appointments or obstetric concerns, or go to [facility] for evaluation  

## 2017-01-08 NOTE — Progress Notes (Signed)
Subjective:     Vicki Griffin is a 36 y.o. [id number] female who presents for a postpartum visit. She is five weeks postpartum following a delivery . I have fully reviewed the prenatal and intrapartum course. The delivery was at 39 gestational weeks; she had GDM. Outcome: low-transverse C-Section.Anesthesia: . Postpartum course has been uncomplicated. Baby's course has been uncomplicated. Baby is feeding by breast/bottle. Bleeding not at this time. Bowel function is normal. Bladder function is normal. Patient is not sexually active. Contraception method is nothing at this time. Postpartum depression screening: negative  The following portions of the patient's history were reviewed and updated as appropriate: allergies, current medications, past family history, past medical history, past social history, past surgical history and problem list. Normal pap on 08/01/2016, negative HPV.  Review of Systems Pertinent items noted in HPI and remainder of comprehensive ROS otherwise negative.   Objective:    BP 107/73   Pulse 85   Wt 237 lb 9.6 oz (107.8 kg)   BMI 38.35 kg/m   General:  alert and no distress   Breasts:  inspection negative, no nipple discharge or bleeding, no masses or nodularity palpable  Lungs: clear to auscultation bilaterally  Heart:  regular rate and rhythm  Abdomen: soft, non-tender; bowel sounds normal; no masses,  no organomegaly. Incision C/D/I, no erythema, no induration,  Pelvic:  not evaluated        Assessment:   Normal postpartum exam. Pap smear not done at today's visit.   Plan:   1. Contraception: rhythm method 2. H/o GDM: Return for 2 hr GTT in about 6-12 weeks postpartum 3. Follow up as needed for other issues.     Vicki CollinsUGONNA  Dionna Wiedemann, MD, FACOG Attending Obstetrician & Gynecologist, [facility], [facility]

## 2017-02-05 ENCOUNTER — Other Ambulatory Visit: Payer: Medicaid Other

## 2017-06-04 ENCOUNTER — Other Ambulatory Visit: Payer: Medicaid Other

## 2017-10-10 IMAGING — US US MFM OB FOLLOW-UP
1 series · 13 of 28 positions shown · non-contrast
Comparison: none

[Series 1: us mfm ob follow-up · 13 of 70 slices shown]
[im 3/70]
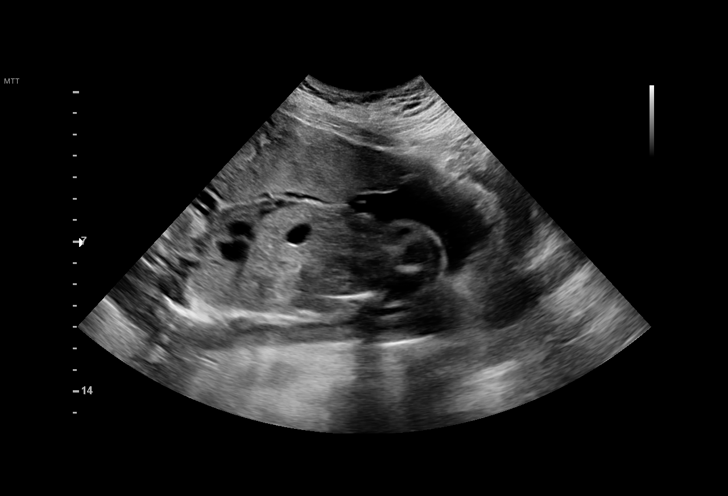
[im 8/70]
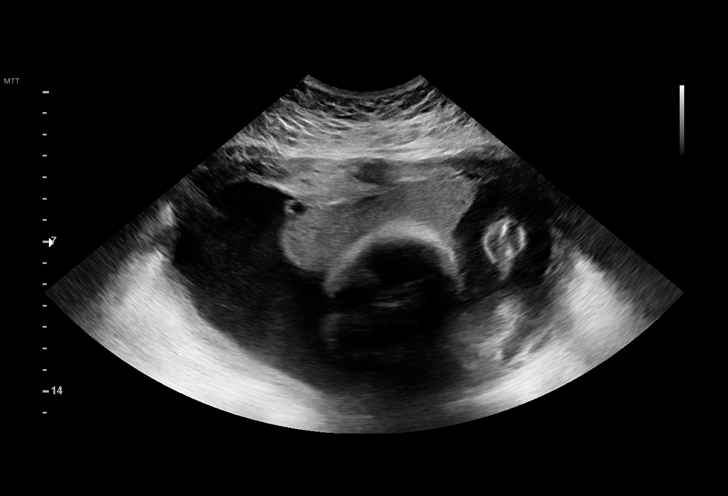
[im 13/70]
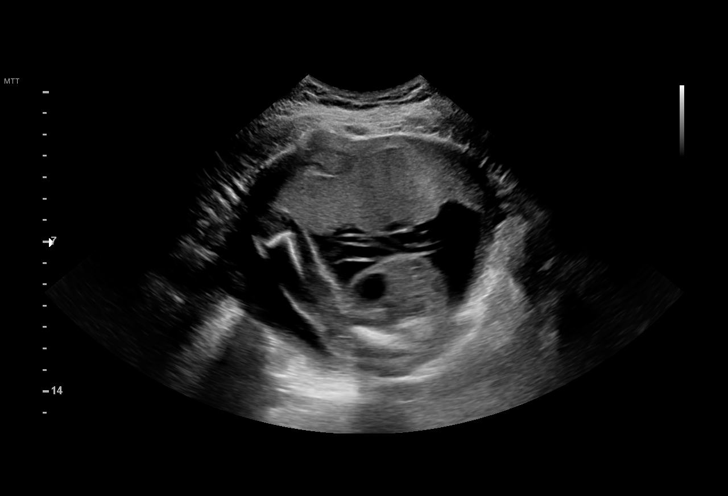
[im 18/70]
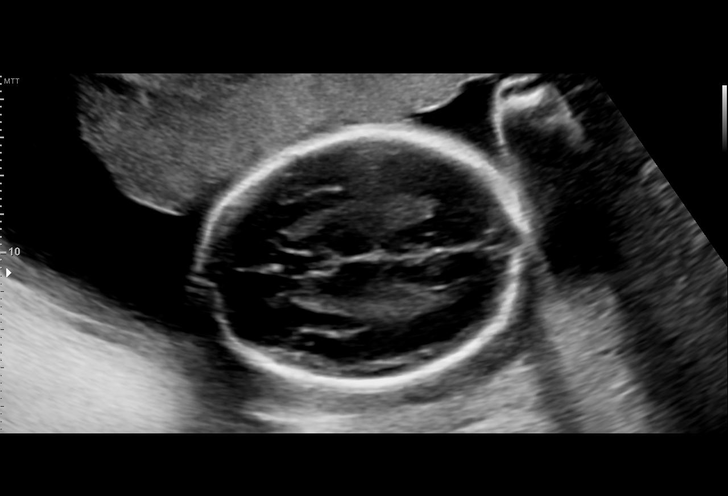
[im 24/70]
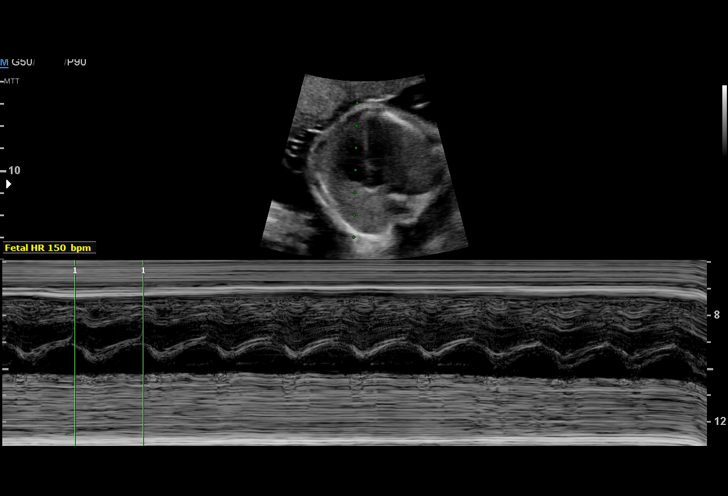
[im 29/70]
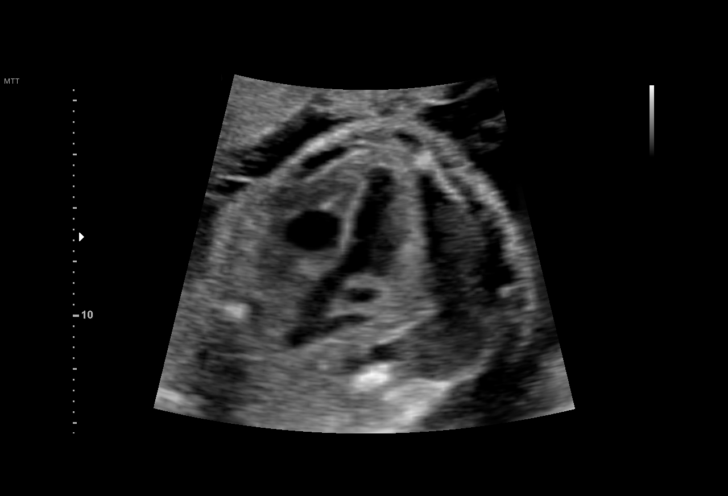
[im 36/70]
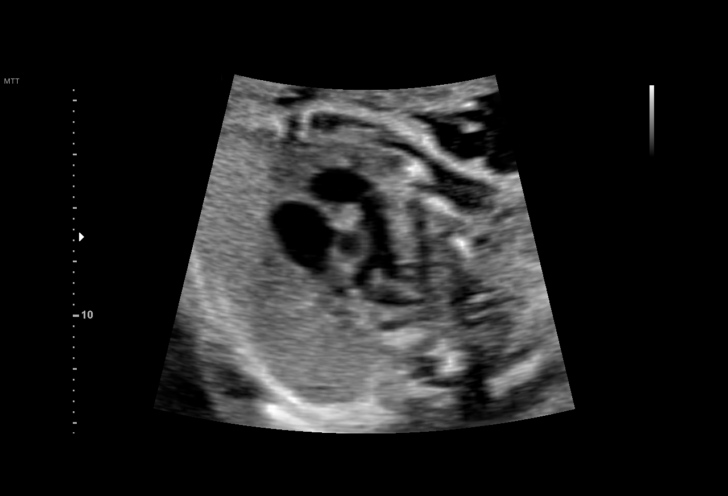
[im 41/70]
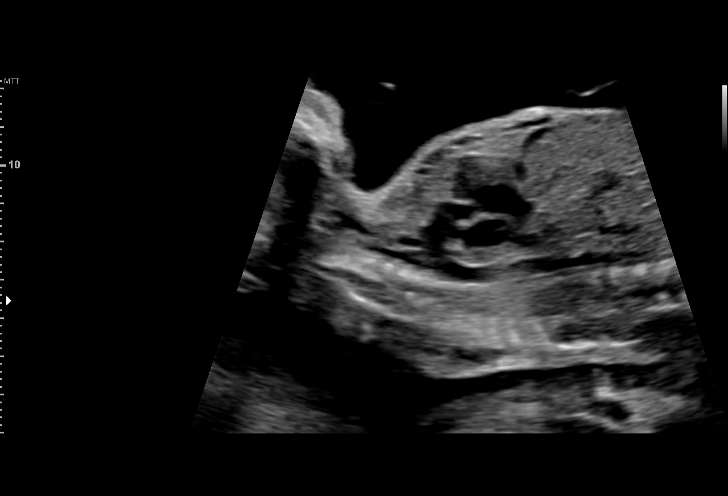
[im 47/70]
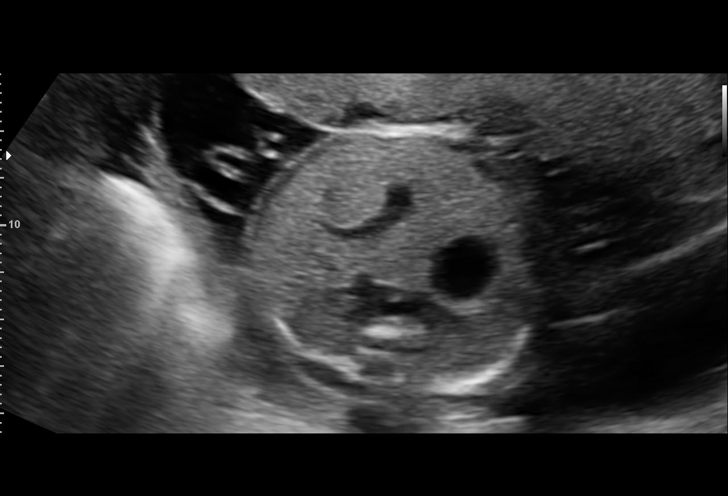
[im 52/70]
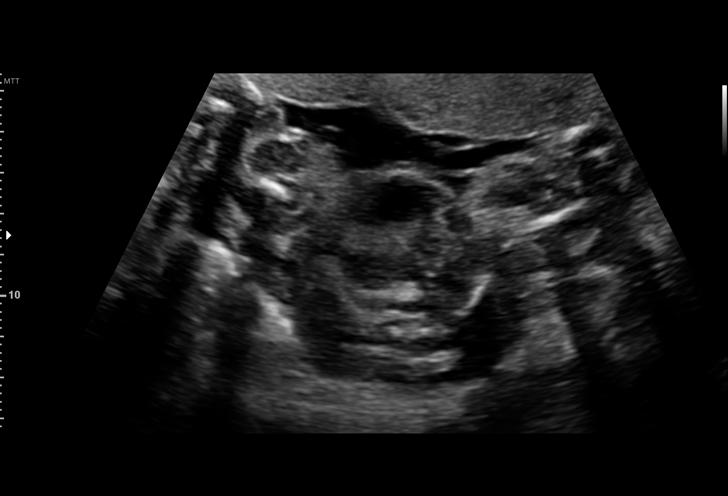
[im 57/70]
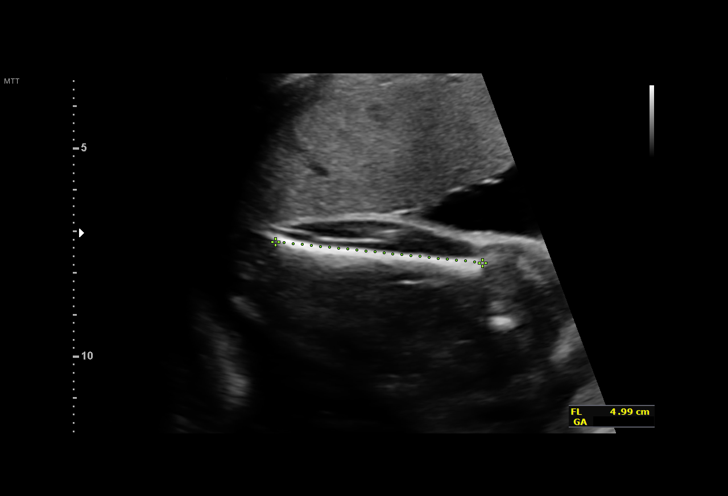
[im 62/70]
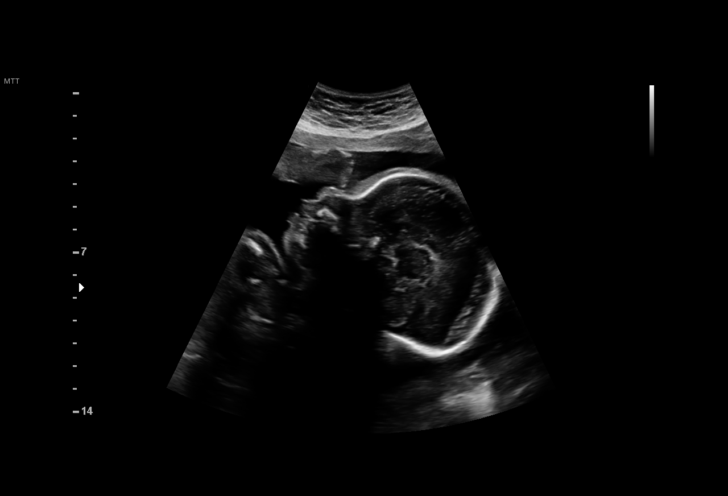
[im 67/70]
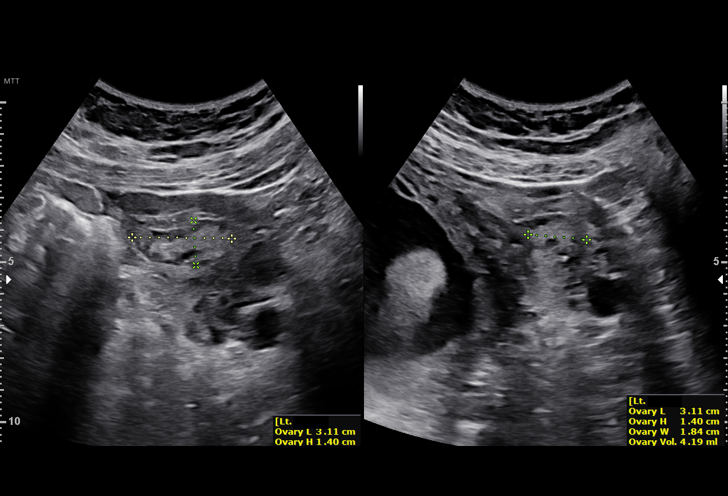

[13 of 28 positions shown; findings below may reference images not displayed]

[REDACTED]

1  SHELLEY MANCERA              [id number]      [id number]     [id number]
Indications

25 weeks gestation of pregnancy
Advanced maternal age multigravida 35+,
second trimester
Late prenatal care, second trimester
History of cesarean delivery, currently
pregnant
Obesity complicating pregnancy, second
trimester
Poor obstetric history (prior pre-term labor)
Family history of congenital anomaly (?
unilateral club foot of previous child)
Encounter for other antenatal screening
follow-up
OB History

Blood Type:            Height:  5'2"   Weight (lb):  247      BMI:
Gravidity:    3         Term:   2        Prem:   0        SAB:   0
TOP:          0       Ectopic:  0        Living: 2
Fetal Evaluation

Num Of Fetuses:     1
Fetal Heart         150
Rate(bpm):
Cardiac Activity:   Observed
Presentation:       Breech
Placenta:           Anterior, above cervical os
P. Cord Insertion:  Previously Visualized
Amniotic Fluid
AFI FV:      Subjectively within normal limits

Largest Pocket(cm)
7.84
Biometry

BPD:      65.5  mm     G. Age:  26w 3d         80  %    CI:        76.71   %   70 - 86
FL/HC:      21.4   %   18.7 -
HC:      236.9  mm     G. Age:  25w 5d         44  %    HC/AC:      1.12       1.04 -
AC:      210.6  mm     G. Age:  25w 4d         51  %    FL/BPD:     77.4   %   71 - 87
FL:       50.7  mm     G. Age:  27w 1d         88  %    FL/AC:      24.1   %   20 - 24
HUM:      45.9  mm     G. Age:  27w 0d         87  %

Est. FW:     909  gm          2 lb      72  %
Gestational Age

LMP:           25w 2d       Date:   03/12/16                 EDD:   12/17/16
U/S Today:     26w 2d                                        EDD:   12/10/16
Best:          25w 2d    Det. By:   LMP  (03/12/16)          EDD:   12/17/16
Anatomy

Cranium:               Appears normal         Aortic Arch:            Appears normal
Cavum:                 Previously seen        Ductal Arch:            Appears normal
Ventricles:            Previously seen        Diaphragm:              Appears normal
Choroid Plexus:        Previously seen        Stomach:                Appears normal, left
sided
Cerebellum:            Previously seen        Abdomen:                Previously seen
Posterior Fossa:       Previously seen        Abdominal Wall:         Previously seen
Nuchal Fold:           Not applicable (>20    Cord Vessels:           Previously seen
wks GA)
Face:                  Orbits and profile     Kidneys:                Appear normal
previously seen
Lips:                  Previously seen        Bladder:                Appears normal
Thoracic:              Previously seen        Spine:                  Previously seen
Heart:                 Appears normal         Upper Extremities:      Previously seen
(4CH, axis, and situs
RVOT:                  Appears normal         Lower Extremities:      Previously seen
LVOT:                  Appears normal

Other:  Female gender previously seen. Heels and 5th digit previously seen.
Nasal bone previously seen. Technically difficult due to maternal
habitus and fetal position.
Cervix Uterus Adnexa

Cervix
Length:           3.23  cm.
Normal appearance by transabdominal scan.

Uterus
No abnormality visualized.

Left Ovary
Size(cm)     3.11  x    1.84   x  1.4       Vol(ml):
Within normal limits. No adnexal mass visualized.
Right Ovary
Size(cm)     2.76  x     2     x  1.99      Vol(ml):
Within normal limits. No adnexal mass visualized.

Cul De Sac:   No free fluid seen.

Adnexa:       No abnormality visualized.
Impression

Singleton intrauterine pregnancy at 25+2 weeks with AMA
and previous child with club foot. Here to complete anatomic
survey
Review of the anatomy shows no sonographic markers for
aneuploidy or structural anomalies
Amniotic fluid volume is normal
Estimated fetal weight is 909g which is growth in the 72nd
percentile
Recommendations

Follow-up ultrasounds as clinically indicated.

## 2018-07-01 IMAGING — US US OB COMP LESS 14 WK
1 series · 14 of 28 positions shown · non-contrast
Comparison: None.

CLINICAL DATA: Acute onset of pelvic pain.  Initial encounter.

EXAM:
OBSTETRIC <14 WK ULTRASOUND
TECHNIQUE: Transabdominal ultrasound was performed for evaluation of the
gestation as well as the maternal uterus and adnexal regions.

[Series 1: us ob comp less 14 wk · 0.23mm/px · 14 of 52 slices shown]
[im 2/52]
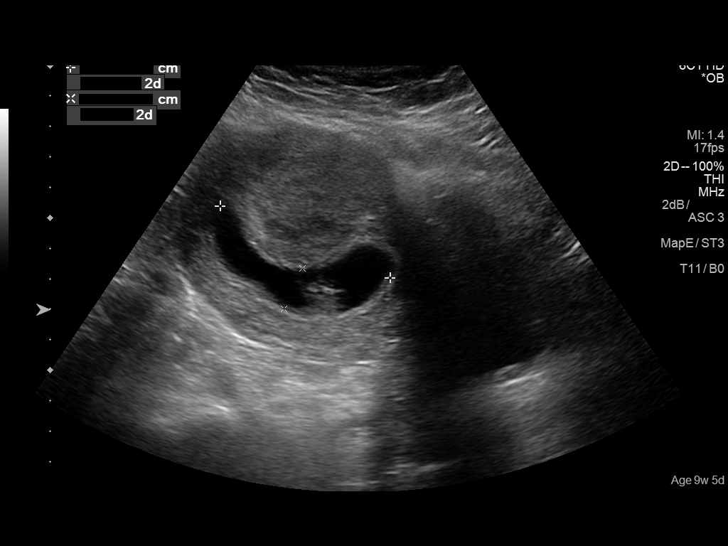
[im 6/52]
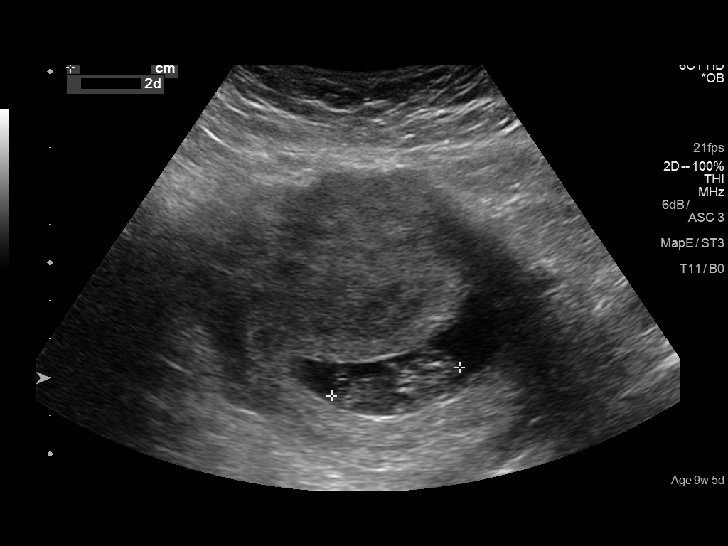
[im 10/52]
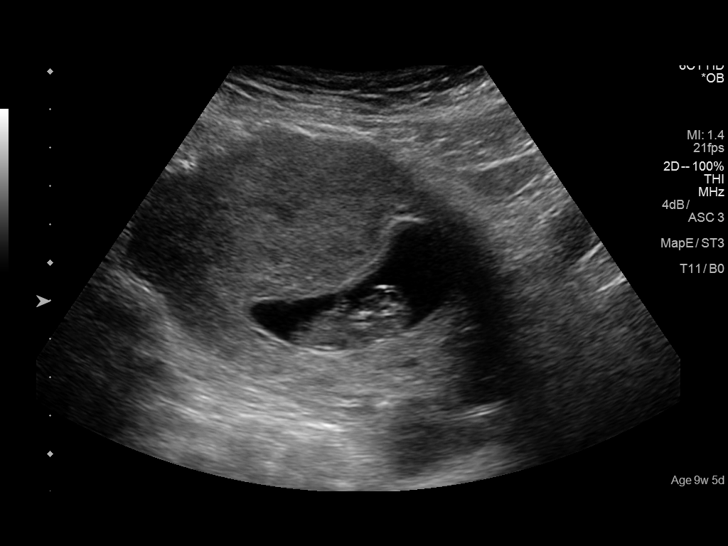
[im 14/52]
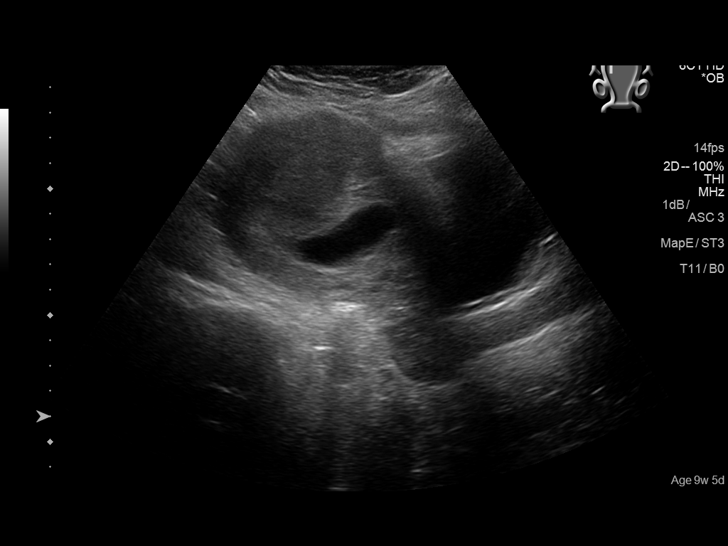
[im 18/52]
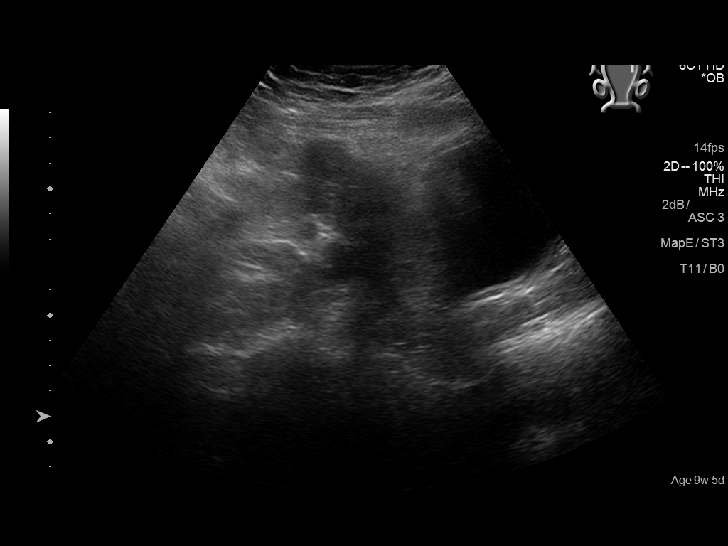
[im 21/52]
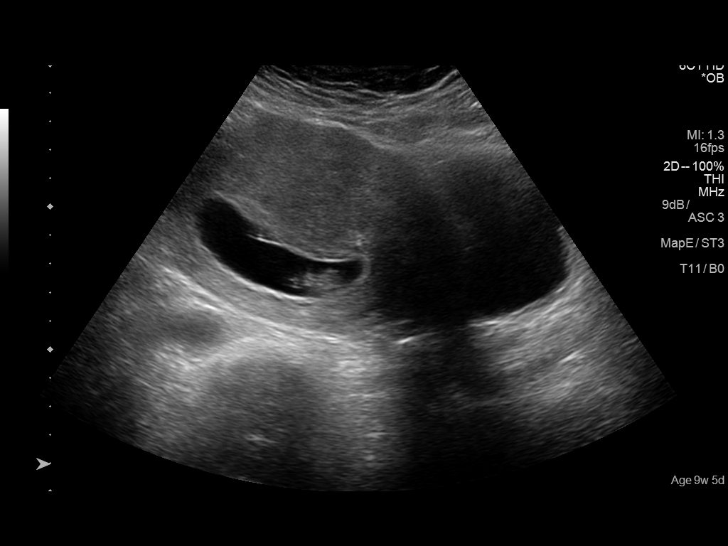
[im 25/52]
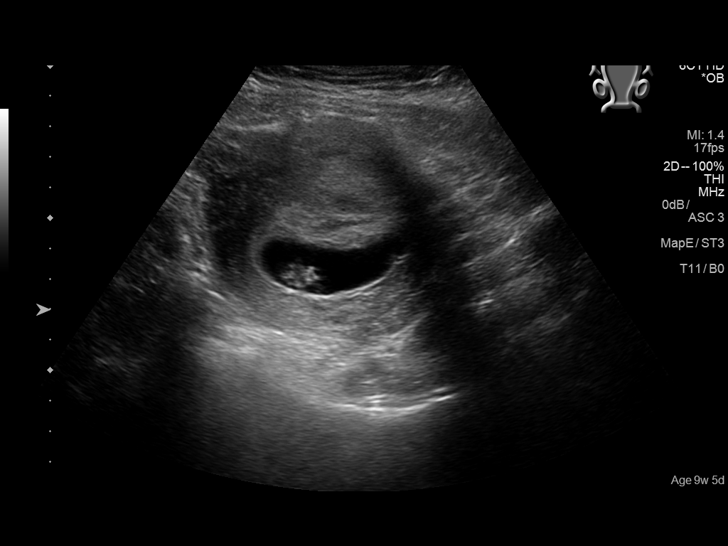
[im 29/52]
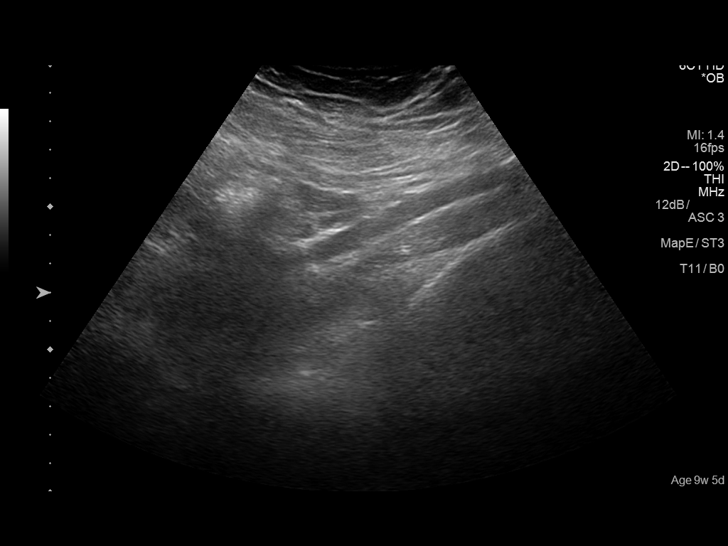
[im 33/52]
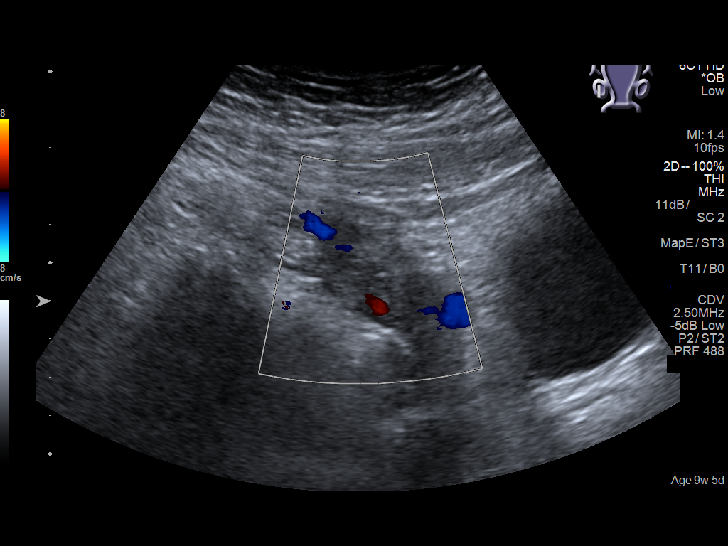
[im 36/52]
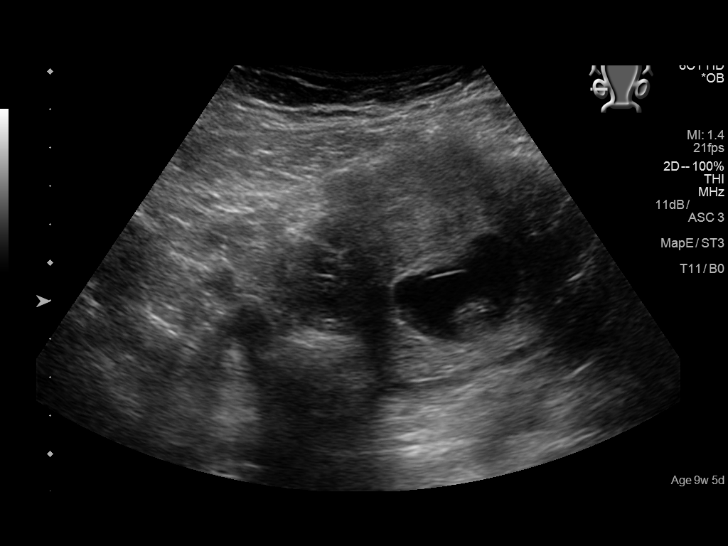
[im 40/52]
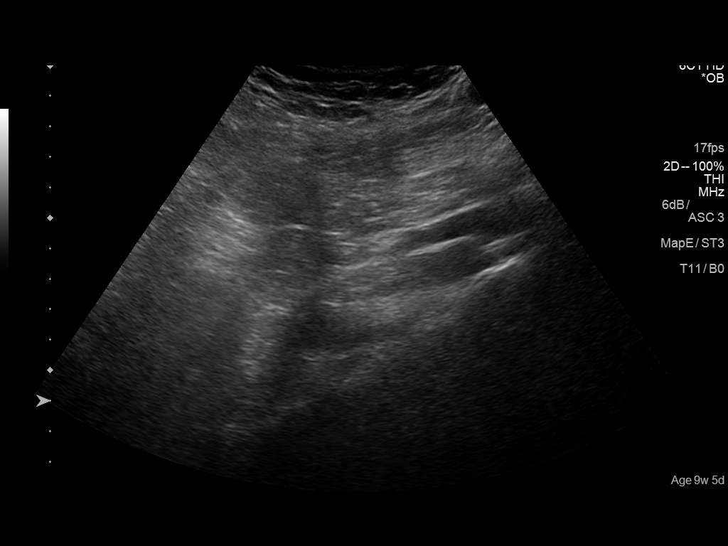
[im 44/52]
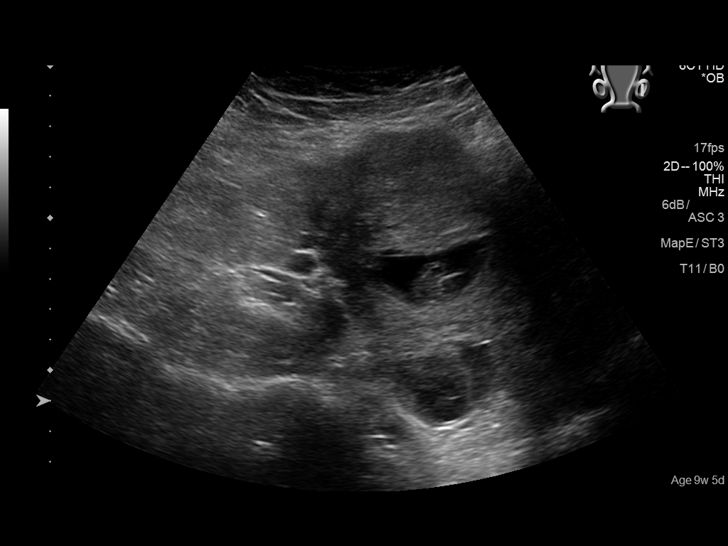
[im 48/52]
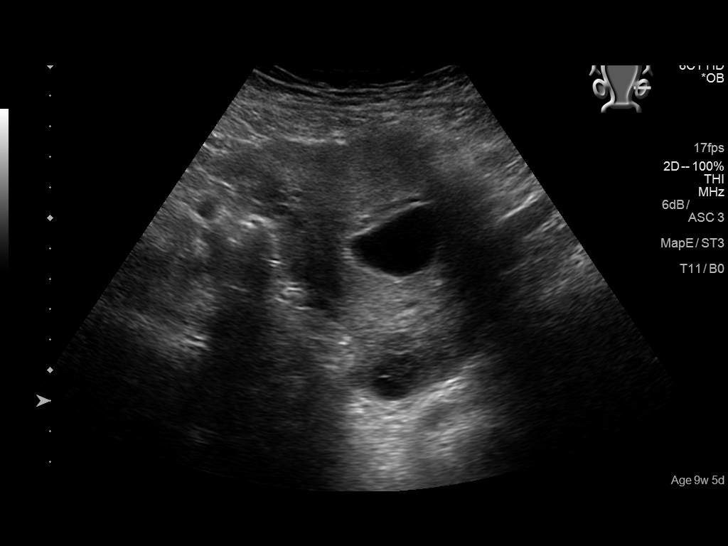
[im 52/52]
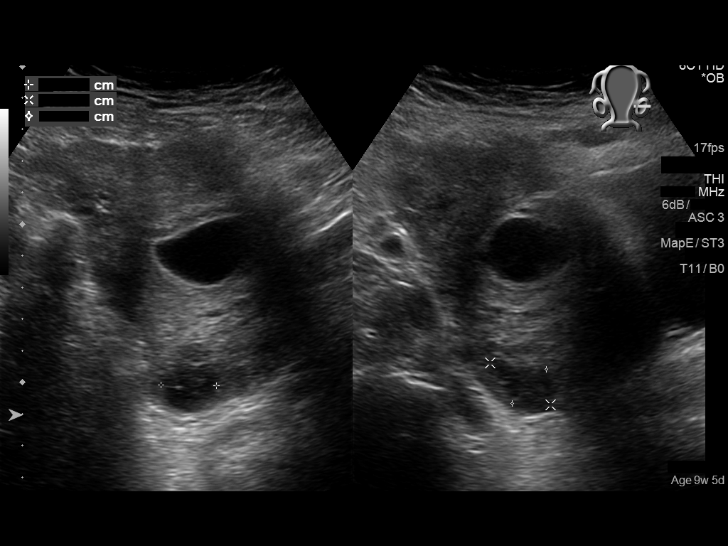

[14 of 28 positions shown; findings below may reference images not displayed]

FINDINGS: Intrauterine gestational sac: Single; visualized and normal in
shape.

Yolk sac:  No

Embryo:  Yes

Cardiac Activity: Yes

Heart Rate: 165 bpm

CRL:   3.3 cm   10 w 1 d                  US EDC: 12/14/2016

Subchorionic hemorrhage:  None visualized.

Maternal uterus/adnexae: The uterus is unremarkable in appearance.

The ovaries are within normal limits. The right ovary measures 3.1 x
2.2 x 1.6 cm, while the left ovary measures 4.1 x 3.1 x 2.3 cm. No
suspicious adnexal masses are seen; there is no evidence for ovarian
torsion.

No free fluid is seen within the pelvic cul-de-sac.
IMPRESSION: Single live intrauterine pregnancy noted, with a crown-rump length
of 3.3 cm, corresponding to a gestational age of 10 weeks 1 day.
This matches the gestational age of 9 weeks 5 days by LMP,
reflecting an estimated date of delivery December 17, 2016.
